# Patient Record
Sex: Female | Born: 1945 | Race: White | Hispanic: No | Smoking: Current some day smoker
Health system: Southern US, Community
[De-identification: ages and names within clinical notes are randomized; demographics above are authoritative.]

## PROBLEM LIST (undated history)

## (undated) DIAGNOSIS — G4733 Obstructive sleep apnea (adult) (pediatric): Secondary | ICD-10-CM

## (undated) DIAGNOSIS — M779 Enthesopathy, unspecified: Secondary | ICD-10-CM

## (undated) DIAGNOSIS — I779 Disorder of arteries and arterioles, unspecified: Secondary | ICD-10-CM

## (undated) DIAGNOSIS — R269 Unspecified abnormalities of gait and mobility: Secondary | ICD-10-CM

## (undated) DIAGNOSIS — C801 Malignant (primary) neoplasm, unspecified: Secondary | ICD-10-CM

## (undated) DIAGNOSIS — M1990 Unspecified osteoarthritis, unspecified site: Secondary | ICD-10-CM

## (undated) DIAGNOSIS — R413 Other amnesia: Secondary | ICD-10-CM

## (undated) DIAGNOSIS — M810 Age-related osteoporosis without current pathological fracture: Secondary | ICD-10-CM

## (undated) DIAGNOSIS — Z9989 Dependence on other enabling machines and devices: Secondary | ICD-10-CM

## (undated) DIAGNOSIS — I739 Peripheral vascular disease, unspecified: Secondary | ICD-10-CM

## (undated) DIAGNOSIS — I4819 Other persistent atrial fibrillation: Secondary | ICD-10-CM

## (undated) DIAGNOSIS — I509 Heart failure, unspecified: Secondary | ICD-10-CM

## (undated) DIAGNOSIS — I10 Essential (primary) hypertension: Secondary | ICD-10-CM

## (undated) DIAGNOSIS — I340 Nonrheumatic mitral (valve) insufficiency: Secondary | ICD-10-CM

## (undated) DIAGNOSIS — K5790 Diverticulosis of intestine, part unspecified, without perforation or abscess without bleeding: Secondary | ICD-10-CM

## (undated) DIAGNOSIS — I2510 Atherosclerotic heart disease of native coronary artery without angina pectoris: Secondary | ICD-10-CM

## (undated) DIAGNOSIS — K922 Gastrointestinal hemorrhage, unspecified: Secondary | ICD-10-CM

## (undated) DIAGNOSIS — K3184 Gastroparesis: Secondary | ICD-10-CM

## (undated) DIAGNOSIS — F418 Other specified anxiety disorders: Secondary | ICD-10-CM

## (undated) HISTORY — DX: Gastroparesis: K31.84

## (undated) HISTORY — DX: Peripheral vascular disease, unspecified: I73.9

## (undated) HISTORY — DX: Nonrheumatic mitral (valve) insufficiency: I34.0

## (undated) HISTORY — PX: BREAST LUMPECTOMY: SHX2

## (undated) HISTORY — DX: Disorder of arteries and arterioles, unspecified: I77.9

## (undated) HISTORY — PX: CORONARY ANGIOPLASTY: SHX604

## (undated) HISTORY — PX: BACK SURGERY: SHX140

## (undated) HISTORY — DX: Gastrointestinal hemorrhage, unspecified: K92.2

## (undated) HISTORY — PX: ABDOMINAL HYSTERECTOMY: SHX81

## (undated) HISTORY — PX: BYPASS GRAFT: SHX909

## (undated) HISTORY — DX: Heart failure, unspecified: I50.9

## (undated) HISTORY — DX: Diverticulosis of intestine, part unspecified, without perforation or abscess without bleeding: K57.90

## (undated) HISTORY — PX: CHOLECYSTECTOMY: SHX55

## (undated) HISTORY — PX: APPENDECTOMY: SHX54

## (undated) HISTORY — DX: Enthesopathy, unspecified: M77.9

## (undated) HISTORY — PX: EYE SURGERY: SHX253

## (undated) HISTORY — DX: Other persistent atrial fibrillation: I48.19

## (undated) HISTORY — PX: TONSILLECTOMY: SUR1361

---

## 2015-07-03 NOTE — Progress Notes (Signed)
Electrophysiology Office Note   Date:  07/04/2015   ID:  Nicole Randall, DOB 1945-11-28, MRN [id number]  PCP:  No primary care provider on file.  Primary Electrophysiologist:  Staton Markey Meredith Leeds, MD    Chief Complaint  Patient presents with  . New Patient (Initial Visit)     History of Present Illness: Nicole Randall is a 70 y.o. female who presents today for electrophysiology evaluation.   She has a history of coronary artery disease status post CABG with unknown bypassed vessels, and persistent atrial fibrillation. She takes Eliquis and amiodarone. She presents today with complaints of shortness of breath and swelling. She says that the swelling is noted going on for a few weeks and she has had weeping from her legs. She says that she gets short of breath with walking just about any distance. She feels like she can lay flat at night and does not wake up in the middle of night feeling short of breath. She has had no chest pain.   Today, he denies symptoms of orthopnea, PND, claudication, dizziness, presyncope, syncope, bleeding, or neurologic sequela. The patient is tolerating medications without difficulties and is otherwise without complaint today.    Past Medical History  Diagnosis Date  . CHF (congestive heart failure) ([place])   . Afib ([place])   . Bone spur    Past Surgical History  Procedure Laterality Date  . Bypass graft    . Back surgery    . Appendectomy    . Coronary angioplasty       Current Outpatient Prescriptions  Medication Sig Dispense Refill  . acetaminophen (TYLENOL) 650 MG CR tablet Take 650 mg by mouth every 8 (eight) hours as needed for pain.    Marland Kitchen albuterol (PROVENTIL) (2.5 MG/3ML) 0.083% nebulizer solution Take 2.5 mg by nebulization every 6 (six) hours as needed for wheezing or shortness of breath.    Marland Kitchen amiodarone (PACERONE) 200 MG tablet Take 200 mg by mouth daily.    Marland Kitchen apixaban (ELIQUIS) 5 MG TABS tablet Take 5 mg by mouth 2 (two) times daily.    Marland Kitchen  aspirin 81 MG tablet Take 81 mg by mouth daily.    Marland Kitchen atorvastatin (LIPITOR) 80 MG tablet Take 80 mg by mouth daily.    . calcium carbonate (TUMS EX) 750 MG chewable tablet Chew 1 tablet by mouth 3 (three) times daily as needed for heartburn.    . Cranberry 450 MG CAPS Take 1 capsule by mouth daily.    . cyanocobalamin 1000 MCG tablet Take 100 mcg by mouth daily.    . DULoxetine (CYMBALTA) 60 MG capsule Take 60 mg by mouth daily.    Marland Kitchen FLUoxetine (PROZAC) 10 MG tablet Take 10 mg by mouth daily.    . furosemide (LASIX) 20 MG tablet Take 20 mg by mouth daily.    Marland Kitchen gabapentin (NEURONTIN) 300 MG capsule Take 300 mg by mouth daily.    Marland Kitchen ibuprofen (ADVIL,MOTRIN) 400 MG tablet Take 400 mg by mouth every 6 (six) hours as needed (pain). Take 1.5-2 tablets (600-870m) every 6 hrs as needed    . Melatonin 5 MG CAPS Take 1 capsule by mouth at bedtime as needed (sleep).    . midodrine (PROAMATINE) 5 MG tablet Take 5 mg by mouth 3 (three) times daily with meals.    . mirabegron ER (MYRBETRIQ) 25 MG TB24 tablet Take 25 mg by mouth daily.    . nitroGLYCERIN (NITROSTAT) 0.4 MG SL tablet Place 0.4 mg under the  tongue every 5 (five) minutes as needed for chest pain. Up to 3 doses    . omeprazole (PRILOSEC) 20 MG capsule Take 20 mg by mouth 2 (two) times daily before a meal.    . ondansetron (ZOFRAN) 4 MG tablet Take 4 mg by mouth every 6 (six) hours as needed for nausea or vomiting.    . polyethylene glycol (MIRALAX / GLYCOLAX) packet Take 17 g by mouth daily as needed (constipation).     . potassium chloride (K-DUR,KLOR-CON) 10 MEQ tablet Take 10 mEq by mouth 2 (two) times daily.    Marland Kitchen senna (SENOKOT) 8.6 MG tablet Take 1 tablet by mouth daily as needed for constipation.    Marland Kitchen spironolactone (ALDACTONE) 25 MG tablet Take 12.5 mg by mouth daily. Take one-half tablet (12.34m)  daily    . traMADol (ULTRAM) 50 MG tablet Take 50 mg by mouth every 6 (six) hours as needed. Take 1-2 tablets (50-1027m every 4 hours as needed  for pain    . traZODone (DESYREL) 50 MG tablet Take 50 mg by mouth at bedtime.     No current facility-administered medications for this visit.    Allergies:   Review of patient's allergies indicates not on file.   Social History:  The patient  reports that he has been smoking.  He does not have any smokeless tobacco history on file. He reports that he does not drink alcohol or use illicit drugs.   Family History:  The patient's family history includes Heart disease in his father; Lung cancer in his mother; Throat cancer in his brother.    ROS:  Please see the history of present illness.   Otherwise, review of systems is positive for palpitations, swelling, cough, DOE, depression, back pain, dizziness.   All other systems are reviewed and negative.    PHYSICAL EXAM: VS:  BP 100/42 mmHg  Pulse 73  Ht 5' 2"  (1.575 m)  Wt 113 lb (51.256 kg)  BMI 20.66 kg/m2 , BMI Body mass index is 20.66 kg/(m^2). GEN: Well nourished, well developed, in no acute distress HEENT: normal Neck: no JVD, carotid bruits, or masses Cardiac: RRR; no murmurs, rubs, or gallops,no edema  Respiratory:  clear to auscultation bilaterally, normal work of breathing GI: soft, nontender, nondistended, + BS MS: no deformity or atrophy Skin: warm and dry Neuro:  Strength and sensation are intact Psych: euthymic mood, full affect  EKG:  EKG is ordered today. The ekg ordered today shows sinus rhythm, inferior Q waves, rate 73  Recent Labs: No results found for requested labs within last 365 days.    Lipid Panel  No results found for: CHOL, TRIG, HDL, CHOLHDL, VLDL, LDLCALC, LDLDIRECT   Wt Readings from Last 3 Encounters:  07/04/15 113 lb (51.256 kg)     ASSESSMENT AND PLAN:  1.  Persistent trial fibrillation: Has CHADS2VASc of at least 3, and is therefore anticoagulated with Eliquis. She is also on amiodarone and is in sinus rhythm today. Monitor her amiodarone Kimiyo Carmicheal check liver function tests today. We'll  continue current management otherwise.  2. Coronary artery disease:has had a CABG with unknown vessel bypass. No chest pain currently.  3. LE edema: has lower extremity edema which is new and is weeping. To further evaluate this,we'll get an echocardiogram, a comprehensive metabolic panel, CBC and a BNP. She may require an increase in her Lasix. We are currently working to get the records from her cardiologist in [place]  Current medicines are reviewed at length  with the patient today.   The patient does not have concerns regarding his medicines.  The following changes were made today:  none  Labs/ tests ordered today include:  Orders Placed This Encounter  Procedures  . CBC w/Diff  . B Nat Peptide  . Comp Met (CMET)  . EKG 12-Lead  . ECHOCARDIOGRAM COMPLETE     Disposition:   FU with Suanne Minahan post TTE  Signed, Jenayah Antu Meredith Leeds, MD  07/04/2015 11:35 AM     [facility] [address] [place] [address] [phone] (office) [phone] (fax)

## 2015-07-04 ENCOUNTER — Encounter: Payer: Self-pay | Admitting: Cardiology

## 2015-07-04 ENCOUNTER — Inpatient Hospital Stay (HOSPITAL_COMMUNITY)
Admission: EM | Admit: 2015-07-04 | Discharge: 2015-07-09 | DRG: 377 | Payer: MEDICARE | Attending: Internal Medicine | Admitting: Internal Medicine

## 2015-07-04 ENCOUNTER — Ambulatory Visit (INDEPENDENT_AMBULATORY_CARE_PROVIDER_SITE_OTHER): Payer: MEDICARE | Admitting: Cardiology

## 2015-07-04 ENCOUNTER — Encounter (HOSPITAL_COMMUNITY): Payer: Self-pay | Admitting: Family Medicine

## 2015-07-04 ENCOUNTER — Telehealth: Payer: Self-pay | Admitting: Cardiology

## 2015-07-04 VITALS — BP 100/42 | HR 73 | Ht 62.0 in | Wt 113.0 lb

## 2015-07-04 DIAGNOSIS — K921 Melena: Secondary | ICD-10-CM | POA: Diagnosis present

## 2015-07-04 DIAGNOSIS — M542 Cervicalgia: Secondary | ICD-10-CM | POA: Diagnosis present

## 2015-07-04 DIAGNOSIS — D649 Anemia, unspecified: Secondary | ICD-10-CM | POA: Diagnosis present

## 2015-07-04 DIAGNOSIS — K3184 Gastroparesis: Secondary | ICD-10-CM | POA: Diagnosis present

## 2015-07-04 DIAGNOSIS — Z8249 Family history of ischemic heart disease and other diseases of the circulatory system: Secondary | ICD-10-CM | POA: Diagnosis not present

## 2015-07-04 DIAGNOSIS — R0602 Shortness of breath: Secondary | ICD-10-CM

## 2015-07-04 DIAGNOSIS — D62 Acute posthemorrhagic anemia: Secondary | ICD-10-CM | POA: Diagnosis present

## 2015-07-04 DIAGNOSIS — G8929 Other chronic pain: Secondary | ICD-10-CM | POA: Diagnosis present

## 2015-07-04 DIAGNOSIS — R627 Adult failure to thrive: Secondary | ICD-10-CM | POA: Diagnosis present

## 2015-07-04 DIAGNOSIS — R64 Cachexia: Secondary | ICD-10-CM | POA: Diagnosis present

## 2015-07-04 DIAGNOSIS — I480 Paroxysmal atrial fibrillation: Secondary | ICD-10-CM | POA: Diagnosis present

## 2015-07-04 DIAGNOSIS — F329 Major depressive disorder, single episode, unspecified: Secondary | ICD-10-CM | POA: Diagnosis present

## 2015-07-04 DIAGNOSIS — K922 Gastrointestinal hemorrhage, unspecified: Secondary | ICD-10-CM

## 2015-07-04 DIAGNOSIS — F17210 Nicotine dependence, cigarettes, uncomplicated: Secondary | ICD-10-CM | POA: Diagnosis present

## 2015-07-04 DIAGNOSIS — Z86010 Personal history of colonic polyps: Secondary | ICD-10-CM

## 2015-07-04 DIAGNOSIS — D500 Iron deficiency anemia secondary to blood loss (chronic): Secondary | ICD-10-CM | POA: Diagnosis present

## 2015-07-04 DIAGNOSIS — Z7982 Long term (current) use of aspirin: Secondary | ICD-10-CM

## 2015-07-04 DIAGNOSIS — G4733 Obstructive sleep apnea (adult) (pediatric): Secondary | ICD-10-CM | POA: Diagnosis present

## 2015-07-04 DIAGNOSIS — L89151 Pressure ulcer of sacral region, stage 1: Secondary | ICD-10-CM | POA: Diagnosis present

## 2015-07-04 DIAGNOSIS — Z79899 Other long term (current) drug therapy: Secondary | ICD-10-CM

## 2015-07-04 DIAGNOSIS — E785 Hyperlipidemia, unspecified: Secondary | ICD-10-CM | POA: Diagnosis present

## 2015-07-04 DIAGNOSIS — Z6821 Body mass index (BMI) 21.0-21.9, adult: Secondary | ICD-10-CM | POA: Diagnosis not present

## 2015-07-04 DIAGNOSIS — D696 Thrombocytopenia, unspecified: Secondary | ICD-10-CM | POA: Diagnosis present

## 2015-07-04 DIAGNOSIS — E43 Unspecified severe protein-calorie malnutrition: Secondary | ICD-10-CM | POA: Insufficient documentation

## 2015-07-04 DIAGNOSIS — Z7901 Long term (current) use of anticoagulants: Secondary | ICD-10-CM

## 2015-07-04 DIAGNOSIS — I5023 Acute on chronic systolic (congestive) heart failure: Secondary | ICD-10-CM | POA: Diagnosis present

## 2015-07-04 DIAGNOSIS — Z951 Presence of aortocoronary bypass graft: Secondary | ICD-10-CM | POA: Diagnosis not present

## 2015-07-04 DIAGNOSIS — N3281 Overactive bladder: Secondary | ICD-10-CM | POA: Diagnosis present

## 2015-07-04 DIAGNOSIS — I2510 Atherosclerotic heart disease of native coronary artery without angina pectoris: Secondary | ICD-10-CM | POA: Diagnosis present

## 2015-07-04 DIAGNOSIS — K5900 Constipation, unspecified: Secondary | ICD-10-CM | POA: Diagnosis present

## 2015-07-04 DIAGNOSIS — I429 Cardiomyopathy, unspecified: Secondary | ICD-10-CM | POA: Diagnosis present

## 2015-07-04 DIAGNOSIS — K5909 Other constipation: Secondary | ICD-10-CM | POA: Diagnosis present

## 2015-07-04 DIAGNOSIS — K5730 Diverticulosis of large intestine without perforation or abscess without bleeding: Secondary | ICD-10-CM | POA: Diagnosis present

## 2015-07-04 DIAGNOSIS — Z801 Family history of malignant neoplasm of trachea, bronchus and lung: Secondary | ICD-10-CM

## 2015-07-04 DIAGNOSIS — Z9989 Dependence on other enabling machines and devices: Secondary | ICD-10-CM

## 2015-07-04 DIAGNOSIS — D509 Iron deficiency anemia, unspecified: Secondary | ICD-10-CM | POA: Diagnosis present

## 2015-07-04 DIAGNOSIS — I10 Essential (primary) hypertension: Secondary | ICD-10-CM | POA: Insufficient documentation

## 2015-07-04 DIAGNOSIS — I509 Heart failure, unspecified: Secondary | ICD-10-CM | POA: Diagnosis not present

## 2015-07-04 DIAGNOSIS — I110 Hypertensive heart disease with heart failure: Secondary | ICD-10-CM | POA: Diagnosis present

## 2015-07-04 DIAGNOSIS — I5020 Unspecified systolic (congestive) heart failure: Secondary | ICD-10-CM | POA: Diagnosis present

## 2015-07-04 DIAGNOSIS — F32A Depression, unspecified: Secondary | ICD-10-CM | POA: Diagnosis present

## 2015-07-04 HISTORY — DX: Essential (primary) hypertension: I10

## 2015-07-04 HISTORY — DX: Age-related osteoporosis without current pathological fracture: M81.0

## 2015-07-04 HISTORY — DX: Other specified anxiety disorders: F41.8

## 2015-07-04 HISTORY — DX: Dependence on other enabling machines and devices: Z99.89

## 2015-07-04 HISTORY — DX: Unspecified abnormalities of gait and mobility: R26.9

## 2015-07-04 HISTORY — DX: Obstructive sleep apnea (adult) (pediatric): G47.33

## 2015-07-04 HISTORY — DX: Atherosclerotic heart disease of native coronary artery without angina pectoris: I25.10

## 2015-07-04 HISTORY — DX: Malignant (primary) neoplasm, unspecified: C80.1

## 2015-07-04 HISTORY — DX: Unspecified osteoarthritis, unspecified site: M19.90

## 2015-07-04 HISTORY — DX: Other amnesia: R41.3

## 2015-07-04 LAB — CBC WITH DIFFERENTIAL/PLATELET
BASOS ABS: 0.1 10*3/uL (ref 0.0–0.1)
Basophils Relative: 1 % (ref 0–1)
EOS ABS: 0 10*3/uL (ref 0.0–0.7)
EOS PCT: 0 % (ref 0–5)
HEMATOCRIT: 16.7 % — AB (ref 39.0–52.0)
Hemoglobin: 4.9 g/dL — CL (ref 13.0–17.0)
LYMPHS ABS: 0.8 10*3/uL (ref 0.7–4.0)
LYMPHS PCT: 12 % (ref 12–46)
MCH: 23.8 pg — AB (ref 26.0–34.0)
MCHC: 29.3 g/dL — ABNORMAL LOW (ref 30.0–36.0)
MCV: 81.1 fL (ref 78.0–100.0)
MONOS PCT: 14 % — AB (ref 3–12)
MPV: 10.9 fL (ref 8.6–12.4)
Monocytes Absolute: 0.9 10*3/uL (ref 0.1–1.0)
NEUTROS PCT: 73 % (ref 43–77)
Neutro Abs: 4.8 10*3/uL (ref 1.7–7.7)
Platelets: 134 10*3/uL — ABNORMAL LOW (ref 150–400)
RBC: 2.06 MIL/uL — ABNORMAL LOW (ref 4.22–5.81)
RDW: 19.7 % — ABNORMAL HIGH (ref 11.5–15.5)
WBC: 6.6 10*3/uL (ref 4.0–10.5)

## 2015-07-04 LAB — PREPARE RBC (CROSSMATCH)

## 2015-07-04 LAB — COMPREHENSIVE METABOLIC PANEL
ALBUMIN: 2.3 g/dL — AB (ref 3.5–5.0)
ALK PHOS: 86 U/L (ref 38–126)
ALT: 26 U/L (ref 14–54)
AST: 43 U/L — ABNORMAL HIGH (ref 15–41)
Anion gap: 9 (ref 5–15)
BILIRUBIN TOTAL: 0.7 mg/dL (ref 0.3–1.2)
BUN: 14 mg/dL (ref 6–20)
CALCIUM: 8.7 mg/dL — AB (ref 8.9–10.3)
CO2: 25 mmol/L (ref 22–32)
CREATININE: 1.05 mg/dL — AB (ref 0.44–1.00)
Chloride: 105 mmol/L (ref 101–111)
GFR calc Af Amer: >60 mL/min (ref >60)
GFR calc non Af Amer: 53 mL/min — ABNORMAL LOW (ref >60)
GLUCOSE: 84 mg/dL (ref 65–99)
Potassium: 4.1 mmol/L (ref 3.5–5.1)
SODIUM: 139 mmol/L (ref 135–145)
TOTAL PROTEIN: 6.3 g/dL — AB (ref 6.5–8.1)

## 2015-07-04 LAB — CBC
HCT: 16.4 % — ABNORMAL LOW (ref 36.0–46.0)
Hemoglobin: 4.7 g/dL — CL (ref 12.0–15.0)
MCH: 23.4 pg — AB (ref 26.0–34.0)
MCHC: 28.7 g/dL — ABNORMAL LOW (ref 30.0–36.0)
MCV: 81.6 fL (ref 78.0–100.0)
PLATELETS: 127 10*3/uL — AB (ref 150–400)
RBC: 2.01 MIL/uL — ABNORMAL LOW (ref 3.87–5.11)
RDW: 20.6 % — AB (ref 11.5–15.5)
WBC: 5.7 10*3/uL (ref 4.0–10.5)

## 2015-07-04 LAB — PHOSPHORUS: Phosphorus: 3.3 mg/dL (ref 2.5–4.6)

## 2015-07-04 LAB — ABO/RH: ABO/RH(D): O POS

## 2015-07-04 LAB — POC OCCULT BLOOD, ED: FECAL OCCULT BLD: POSITIVE — AB

## 2015-07-04 LAB — BRAIN NATRIURETIC PEPTIDE

## 2015-07-04 LAB — MAGNESIUM: MAGNESIUM: 1.7 mg/dL (ref 1.7–2.4)

## 2015-07-04 MED ORDER — LEVALBUTEROL HCL 1.25 MG/0.5ML IN NEBU: 1.2500 mg | INHALATION_SOLUTION | Freq: Four times a day (QID) | RESPIRATORY_TRACT | Status: DC | PRN

## 2015-07-04 MED ORDER — ONDANSETRON HCL 4 MG/2ML IJ SOLN
4.0000 mg | Freq: Four times a day (QID) | INTRAMUSCULAR | Status: DC | PRN
  Administered 2015-07-05: 4 mg via INTRAVENOUS
  Filled 2015-07-04: qty 2

## 2015-07-04 MED ORDER — SODIUM CHLORIDE 0.9% FLUSH
3.0000 mL | Freq: Two times a day (BID) | INTRAVENOUS | Status: DC
  Administered 2015-07-04 – 2015-07-08 (×8): 3 mL via INTRAVENOUS

## 2015-07-04 MED ORDER — ONDANSETRON HCL 4 MG PO TABS: 4.0000 mg | ORAL_TABLET | Freq: Four times a day (QID) | ORAL | Status: DC | PRN

## 2015-07-04 MED ORDER — MAGNESIUM SULFATE 2 GM/50ML IV SOLN
2.0000 g | Freq: Once | INTRAVENOUS | Status: AC
  Administered 2015-07-05: 2 g via INTRAVENOUS
  Filled 2015-07-04: qty 50

## 2015-07-04 MED ORDER — SODIUM CHLORIDE 0.9 % IV SOLN
Freq: Once | INTRAVENOUS | Status: AC
  Administered 2015-07-04: 23:00:00 via INTRAVENOUS

## 2015-07-04 MED ORDER — SODIUM CHLORIDE 0.9 % IV SOLN
10.0000 mL/h | Freq: Once | INTRAVENOUS | Status: AC
  Administered 2015-07-04: 10 mL/h via INTRAVENOUS

## 2015-07-04 MED ORDER — IPRATROPIUM BROMIDE 0.02 % IN SOLN: 0.5000 mg | Freq: Four times a day (QID) | RESPIRATORY_TRACT | Status: DC | PRN

## 2015-07-04 MED ORDER — MORPHINE SULFATE (PF) 2 MG/ML IV SOLN
2.0000 mg | INTRAVENOUS | Status: DC | PRN
  Administered 2015-07-04 – 2015-07-08 (×6): 2 mg via INTRAVENOUS
  Filled 2015-07-04 (×6): qty 1

## 2015-07-04 MED ORDER — PANTOPRAZOLE SODIUM 40 MG IV SOLR
40.0000 mg | Freq: Two times a day (BID) | INTRAVENOUS | Status: DC
  Administered 2015-07-04 – 2015-07-05 (×2): 40 mg via INTRAVENOUS
  Filled 2015-07-04 (×2): qty 40

## 2015-07-04 NOTE — ED Provider Notes (Signed)
CSN: [id number]     Arrival date & time 07/04/15  1553 History   First MD Initiated Contact with Patient 07/04/15 1647     Chief Complaint  Patient presents with  . low hgb       HPI Sent in for low Hgb.  Seen by cardiologist this am.  Has had weakness, fatigue and SOB for last few weeks.  Just moved to the area.  Had some melena for the past few months and unexplained weight loss.  On Elequis for afib and carotid blockages. Past Medical History  Diagnosis Date  . CHF (congestive heart failure) ([place])   . Afib ([place])   . Bone spur   . Arthritis   . Cancer ([facility])     carcinoma left leg  . Coronary artery disease   . Hypertension    Past Surgical History  Procedure Laterality Date  . Bypass graft    . Back surgery    . Appendectomy    . Coronary angioplasty    . Cholecystectomy    . Tonsillectomy    . Abdominal hysterectomy      partial  . Eye surgery      bilat cataract, lasix surgery  . Breast lumpectomy Left     1980's   Family History  Problem Relation Age of Onset  . Lung cancer Mother   . Heart disease Father   . Throat cancer Brother    Social History  Substance Use Topics  . Smoking status: Current Some Day Smoker -- 0.20 packs/day  . Smokeless tobacco: Never Used  . Alcohol Use: No   OB History    No data available     Review of Systems  Constitutional: Positive for activity change and unexpected weight change.  Cardiovascular: Positive for leg swelling.  Gastrointestinal: Positive for abdominal distention. Negative for abdominal pain.  Neurological: Positive for dizziness.  All other systems reviewed and are negative.     Allergies  Review of patient's allergies indicates no known allergies.  Home Medications   Prior to Admission medications   Medication Sig Start Date End Date Taking? Authorizing Provider  albuterol (PROVENTIL) (2.5 MG/3ML) 0.083% nebulizer solution Take 2.5 mg by nebulization every 6 (six) hours as needed for wheezing or  shortness of breath.   Yes Historical Provider, MD  amiodarone (PACERONE) 200 MG tablet Take 200 mg by mouth daily.   Yes Historical Provider, MD  apixaban (ELIQUIS) 5 MG TABS tablet Take 5 mg by mouth 2 (two) times daily.   Yes Historical Provider, MD  aspirin 81 MG tablet Take 81 mg by mouth daily.   Yes Historical Provider, MD  atorvastatin (LIPITOR) 80 MG tablet Take 80 mg by mouth daily.   Yes Historical Provider, MD  calcium carbonate (TUMS EX) 750 MG chewable tablet Chew 1 tablet by mouth 3 (three) times daily as needed for heartburn.   Yes Historical Provider, MD  Cranberry 450 MG CAPS Take 1 capsule by mouth daily.   Yes Historical Provider, MD  cyanocobalamin 1000 MCG tablet Take 100 mcg by mouth daily.   Yes Historical Provider, MD  DULoxetine (CYMBALTA) 60 MG capsule Take 60 mg by mouth daily.   Yes Historical Provider, MD  FLUoxetine (PROZAC) 10 MG tablet Take 10 mg by mouth daily.   Yes Historical Provider, MD  furosemide (LASIX) 20 MG tablet Take 20 mg by mouth daily.   Yes Historical Provider, MD  gabapentin (NEURONTIN) 300 MG capsule Take 300 mg by mouth  daily.   Yes Historical Provider, MD  Melatonin 5 MG CAPS Take 1 capsule by mouth at bedtime as needed (sleep).   Yes Historical Provider, MD  midodrine (PROAMATINE) 5 MG tablet Take 5 mg by mouth 3 (three) times daily with meals.   Yes Historical Provider, MD  mirabegron ER (MYRBETRIQ) 25 MG TB24 tablet Take 25 mg by mouth daily.   Yes Historical Provider, MD  nitroGLYCERIN (NITROSTAT) 0.4 MG SL tablet Place 0.4 mg under the tongue every 5 (five) minutes as needed for chest pain. Up to 3 doses   Yes Historical Provider, MD  omeprazole (PRILOSEC) 20 MG capsule Take 20 mg by mouth 2 (two) times daily before a meal.   Yes Historical Provider, MD  ondansetron (ZOFRAN) 4 MG tablet Take 4 mg by mouth every 6 (six) hours as needed for nausea or vomiting.   Yes Historical Provider, MD  polyethylene glycol (MIRALAX / GLYCOLAX) packet Take  17 g by mouth daily as needed (constipation).    Yes Historical Provider, MD  potassium chloride (K-DUR,KLOR-CON) 10 MEQ tablet Take 10 mEq by mouth 2 (two) times daily.   Yes Historical Provider, MD  senna (SENOKOT) 8.6 MG tablet Take 1 tablet by mouth daily as needed for constipation.   Yes Historical Provider, MD  spironolactone (ALDACTONE) 25 MG tablet Take 12.5 mg by mouth daily. Take one-half tablet (12.5mg )  daily   Yes Historical Provider, MD  traMADol (ULTRAM) 50 MG tablet Take 50 mg by mouth every 6 (six) hours as needed. Take 1-2 tablets (50-100mg ) every 4 hours as needed for pain   Yes Historical Provider, MD  traZODone (DESYREL) 50 MG tablet Take 50 mg by mouth at bedtime.   Yes Historical Provider, MD   BP 97/48 mmHg  Pulse 72  Temp(Src) 98.3 F (36.8 C) (Oral)  Resp 18  Ht 5\' 2"  (1.575 m)  Wt 116 lb 2.9 oz (52.7 kg)  BMI 21.24 kg/m2  SpO2 95% Physical Exam  Constitutional: She is oriented to person, place, and time. She appears well-developed and well-nourished. No distress.  HENT:  Head: Normocephalic and atraumatic.  Eyes: Pupils are equal, round, and reactive to light.  Neck: Normal range of motion.  Cardiovascular: Normal rate and intact distal pulses.   Pulmonary/Chest: No respiratory distress.  Abdominal: Normal appearance. She exhibits no distension. There is no tenderness. There is no rebound.  Musculoskeletal: Normal range of motion.  Neurological: She is alert and oriented to person, place, and time. No cranial nerve deficit.  Skin: Skin is warm and dry. No rash noted.  Psychiatric: She has a normal mood and affect. Her behavior is normal.  Nursing note and vitals reviewed.   ED Course  Procedures (including critical care time) Labs Review Labs Reviewed  COMPREHENSIVE METABOLIC PANEL - Abnormal; Notable for the following:    Creatinine, Ser 1.05 (*)    Calcium 8.7 (*)    Total Protein 6.3 (*)    Albumin 2.3 (*)    AST 43 (*)    GFR calc non Af Amer  53 (*)    All other components within normal limits  CBC - Abnormal; Notable for the following:    RBC 2.01 (*)    Hemoglobin 4.7 (*)    HCT 16.4 (*)    MCH 23.4 (*)    MCHC 28.7 (*)    RDW 20.6 (*)    Platelets 127 (*)    All other components within normal limits  COMPREHENSIVE METABOLIC PANEL -  Abnormal; Notable for the following:    Creatinine, Ser 1.03 (*)    Calcium 8.6 (*)    Total Protein 6.1 (*)    Albumin 2.3 (*)    GFR calc non Af Amer 54 (*)    All other components within normal limits  CBC WITH DIFFERENTIAL/PLATELET - Abnormal; Notable for the following:    RBC 3.47 (*)    Hemoglobin 9.0 (*)    HCT 28.3 (*)    MCH 25.9 (*)    RDW 17.6 (*)    Platelets 72 (*)    All other components within normal limits  POC OCCULT BLOOD, ED - Abnormal; Notable for the following:    Fecal Occult Bld POSITIVE (*)    All other components within normal limits  MAGNESIUM  PHOSPHORUS  URINALYSIS, ROUTINE W REFLEX MICROSCOPIC (NOT AT [facility])  CBC WITH DIFFERENTIAL/PLATELET  TYPE AND SCREEN  PREPARE RBC (CROSSMATCH)  ABO/RH  PREPARE RBC (CROSSMATCH)    Imaging Review No results found. I have personally reviewed and evaluated these images and lab results as part of my medical decision-making.   EKG Interpretation   Date/Time:  Wednesday July 04 2015 17:22:53 EST Ventricular Rate:  102 PR Interval:  124 QRS Duration: 117 QT Interval:  385 QTC Calculation: 501 R Axis:   93 Text Interpretation:  Sinus tachycardia Left posterior fascicular block  Low voltage, extremity leads Probable lateral infarct, age indeterminate  Confirmed by Benn Tarver  MD, Jamarques Pinedo [phone]) on 07/04/2015 6:15:14 PM     I discussed with Dr. Fuller Plan, GI, who will see patient in AM.  Will admit to hospitalist. MDM   Final diagnoses:  Gastrointestinal hemorrhage, unspecified gastritis, unspecified gastrointestinal hemorrhage type  Anemia, unspecified anemia type        Leonard Schwartz, MD 07/05/15 2256

## 2015-07-04 NOTE — Telephone Encounter (Signed)
See lab result from today for documentation on this critical lab

## 2015-07-04 NOTE — ED Notes (Signed)
Critical Lab Value  Hgb 4.7

## 2015-07-04 NOTE — ED Notes (Signed)
Pt here for low hgb of 4. sts weakness, dizziness, and hypotension.

## 2015-07-04 NOTE — Patient Instructions (Signed)
Medication Instructions:  Your physician recommends that you continue on your current medications as directed. Please refer to the Current Medication list given to you today.  Labwork: Today: CMET, CBCD, BNP  Testing/Procedures: Your physician has requested that you have an echocardiogram. Echocardiography is a painless test that uses sound waves to create images of your heart. It provides your doctor with information about the size and shape of your heart and how well your heart's chambers and valves are working. This procedure takes approximately one hour. There are no restrictions for this procedure.  Follow-Up: To be determined once lab and echo results have been reviewed.  Thank you for choosing [facility]!!

## 2015-07-04 NOTE — Progress Notes (Signed)
Called ER RN for report. RN not ready.

## 2015-07-04 NOTE — Telephone Encounter (Signed)
[facility] callign with Critical Labs

## 2015-07-04 NOTE — H&P (Signed)
[facility] History and Physical  Nicole Randall [id number] DOB: 1945-12-27 DOA: 07/04/2015  Referring physician: Leonard Schwartz, M.D. PCP: No primary care provider on file.   Chief Complaint: Low hemoglobin level.  HPI: Nicole Randall is a 70 y.o. female with a past medical history of systolic CHF, CAD, carotid artery disease, paroxysmal atrial fibrillation, hypertension, hyperlipidemia, depression, anxiety who comes to the emergency department after she was called from her cardiologist's office about her results from earlier in the day labs, due to having a hemoglobin level of 4.7 g/dL.  Per patient, she has had increased fatigue, dyspnea, weakness for several weeks. She is states that she has had some melena in the stools as well. Her daughter reports that her last hemoglobin level over a month ago was 11 g/dL. She denies current abdominal pain, hematemesis, hematochezia, but complains of loss of appetite and occasional nausea.  The patient recently moved from [place], [place] to the [place] area where her daughter leaves. She was staying at an assisted living facility after having multiple hospitalizations in the past year, failure to thrive, decreased appetite and weight loss of about 50 pounds since then. She is receiving home health wound care due to chronic edema, with occasional oozing blisters and erythema. She denies fevers, but complains of chills and fatigue.  When seen in the emergency department, the patient was in no acute distress. We are admitting her for blood transfusions and GI evaluation during this hospitalization.   Review of Systems:  Constitutional:  Positive chills and fatigue.  No weight loss, night sweats, Fevers. HEENT:  No headaches, Difficulty swallowing,Tooth/dental problems,Sore throat,  No sneezing, itching, ear ache, nasal congestion, post nasal drip,  Cardio-vascular:  Positive swelling in lower extremities.  No chest pain, Orthopnea, PND,  anasarca, dizziness, palpitations  GI:  As above mentioned Resp:  Positive dyspnea, occasional productive cough, no wheezing, no hemoptysis. Skin:  Chronic erythema and edema of lower extremities. GU:  no dysuria, change in color of urine, no urgency or frequency. No flank pain.  Musculoskeletal:  Occasional arthralgias/myalgias/weakness and overall decreased range of motion. Psych:  History of depression and anxiety.  Past Medical History  Diagnosis Date  . CHF (congestive heart failure) ([facility])   . Afib ([facility])   . Bone spur   . Arthritis   . Cancer (HCC)     carcinoma left leg  . Coronary artery disease   . Hypertension    Past Surgical History  Procedure Laterality Date  . Bypass graft    . Back surgery    . Appendectomy    . Coronary angioplasty    . Cholecystectomy    . Tonsillectomy    . Abdominal hysterectomy      partial  . Eye surgery      bilat cataract, lasix surgery  . Breast lumpectomy Left     1980's   Social History:  reports that she has been smoking.  She has never used smokeless tobacco. She reports that she does not drink alcohol or use illicit drugs.  No Known Allergies  Family History  Problem Relation Age of Onset  . Lung cancer Mother   . Heart disease Father   . Throat cancer Brother     Prior to Admission medications   Medication Sig Start Date End Date Taking? Authorizing Provider  acetaminophen (TYLENOL) 650 MG CR tablet Take 650 mg by mouth every 8 (eight) hours as needed for pain.    Historical Provider, MD  albuterol (PROVENTIL) (  2.5 MG/3ML) 0.083% nebulizer solution Take 2.5 mg by nebulization every 6 (six) hours as needed for wheezing or shortness of breath.    Historical Provider, MD  amiodarone (PACERONE) 200 MG tablet Take 200 mg by mouth daily.    Historical Provider, MD  apixaban (ELIQUIS) 5 MG TABS tablet Take 5 mg by mouth 2 (two) times daily.    Historical Provider, MD  aspirin 81 MG tablet Take 81 mg by mouth daily.     Historical Provider, MD  atorvastatin (LIPITOR) 80 MG tablet Take 80 mg by mouth daily.    Historical Provider, MD  calcium carbonate (TUMS EX) 750 MG chewable tablet Chew 1 tablet by mouth 3 (three) times daily as needed for heartburn.    Historical Provider, MD  Cranberry 450 MG CAPS Take 1 capsule by mouth daily.    Historical Provider, MD  cyanocobalamin 1000 MCG tablet Take 100 mcg by mouth daily.    Historical Provider, MD  DULoxetine (CYMBALTA) 60 MG capsule Take 60 mg by mouth daily.    Historical Provider, MD  FLUoxetine (PROZAC) 10 MG tablet Take 10 mg by mouth daily.    Historical Provider, MD  furosemide (LASIX) 20 MG tablet Take 20 mg by mouth daily.    Historical Provider, MD  gabapentin (NEURONTIN) 300 MG capsule Take 300 mg by mouth daily.    Historical Provider, MD  Melatonin 5 MG CAPS Take 1 capsule by mouth at bedtime as needed (sleep).    Historical Provider, MD  midodrine (PROAMATINE) 5 MG tablet Take 5 mg by mouth 3 (three) times daily with meals.    Historical Provider, MD  mirabegron ER (MYRBETRIQ) 25 MG TB24 tablet Take 25 mg by mouth daily.    Historical Provider, MD  nitroGLYCERIN (NITROSTAT) 0.4 MG SL tablet Place 0.4 mg under the tongue every 5 (five) minutes as needed for chest pain. Up to 3 doses    Historical Provider, MD  omeprazole (PRILOSEC) 20 MG capsule Take 20 mg by mouth 2 (two) times daily before a meal.    Historical Provider, MD  ondansetron (ZOFRAN) 4 MG tablet Take 4 mg by mouth every 6 (six) hours as needed for nausea or vomiting.    Historical Provider, MD  polyethylene glycol (MIRALAX / GLYCOLAX) packet Take 17 g by mouth daily as needed (constipation).     Historical Provider, MD  potassium chloride (K-DUR,KLOR-CON) 10 MEQ tablet Take 10 mEq by mouth 2 (two) times daily.    Historical Provider, MD  senna (SENOKOT) 8.6 MG tablet Take 1 tablet by mouth daily as needed for constipation.    Historical Provider, MD  spironolactone (ALDACTONE) 25 MG tablet  Take 12.5 mg by mouth daily. Take one-half tablet (12.5mg )  daily    Historical Provider, MD  traMADol (ULTRAM) 50 MG tablet Take 50 mg by mouth every 6 (six) hours as needed. Take 1-2 tablets (50-100mg ) every 4 hours as needed for pain    Historical Provider, MD  traZODone (DESYREL) 50 MG tablet Take 50 mg by mouth at bedtime.    Historical Provider, MD   Physical Exam: Filed Vitals:   07/04/15 1801 07/04/15 1815 07/04/15 1819 07/04/15 1900  BP: 120/60 107/57 107/57 110/57  Pulse: 102 102 102 103  Temp:   98.6 F (37 C)   TempSrc:   Oral   Resp: 19 19 17 19   SpO2: 97% 100% 100% 99%    Wt Readings from Last 3 Encounters:  07/04/15 51.256 kg (113 lb)  General:  Appears calm and comfortable Eyes: PERRL, normal lids, irises & conjunctiva ENT: grossly normal hearing, lips & tongue Neck: no LAD, masses or thyromegaly Cardiovascular: RRR, no m/r/g. Trace LE edema. Telemetry: SR, no arrhythmias  Respiratory: CTA bilaterally, no w/r/r. Normal respiratory effort. Abdomen: BS+, soft, mild LLQ tenderness, no guarding, no rebound tenderness. Skin: Positive erythema and mild edema on LLE. Musculoskeletal: grossly normal tone BUE/BLE Psychiatric: grossly normal mood and affect, speech fluent and appropriate Neurologic: AAO x3, grossly non-focal.          Labs on Admission:  Basic Metabolic Panel:  Recent Labs Lab 07/04/15 1609  NA 139  K 4.1  CL 105  CO2 25  GLUCOSE 84  BUN 14  CREATININE 1.05*  CALCIUM 8.7*  MG 1.7  PHOS 3.3   Liver Function Tests:  Recent Labs Lab 07/04/15 1609  AST 43*  ALT 26  ALKPHOS 86  BILITOT 0.7  PROT 6.3*  ALBUMIN 2.3*   CBC:  Recent Labs Lab 07/04/15 1127 07/04/15 1609  WBC 6.6 5.7  NEUTROABS 4.8  --   HGB 4.9* 4.7*  HCT 16.7* 16.4*  MCV 81.1 81.6  PLT 134* 127*     EKG: Independently reviewed. Vent. rate 102 BPM PR interval 124 ms QRS duration 117 ms QT/QTc 385/501 ms P-R-T axes 0 93 90 Sinus tachycardia Left  posterior fascicular block Low voltage, extremity leads Probable lateral infarct, age indeterminate  Assessment/Plan Principal Problem:   UGI bleed   Symptomatic anemia Admit to telemetry. Hold eliquis and aspirin. Continue PRBCs transfusion. Monitor hematocrit and hemoglobin. GI evaluation in a.m.  Active Problems:   Systolic CHF (HCC) Stable. Resume diuretics once seen by GI.    CAD (coronary artery disease) Stable, denies chest pain. Resume aspirin once cleared by GI.    PAF (paroxysmal atrial fibrillation) (HCC) Currently in sinus rhythm. Continue telemetry monitoring. Monitor electrolytes closely. The coagulation has been held. Resume amiodarone after seen by GI.    Stage I pressure ulcer of sacral region Continue local care. Monitor closely    Depression Resume Cymbalta, trazodone and fluoxetine once cleared by GI.    Constipation Resume MiraLAX and stool softeners once cleared for oral intake.    OSA on CPAP Continue CPAP at night with a pressure of 8 cm of water.    Hyperlipidemia Resume atorvastatin once cleared by GI.   Dr. Fuller Plan will evaluate in a.m.   Code Status: Full code. DVT Prophylaxis: SCDs Family Communication: Her daughter was present in the room. Disposition Plan: Admit to telemetry, transfuse packed RBCs and GI evaluation in a.m.  Time spent: 70 minutes were spent in the process admission.  Reubin Milan, M.D. [facility] Pager [phone].

## 2015-07-04 NOTE — ED Notes (Signed)
Dr. Beaton MD at bedside. 

## 2015-07-05 ENCOUNTER — Ambulatory Visit (HOSPITAL_COMMUNITY): Payer: MEDICARE

## 2015-07-05 DIAGNOSIS — L89151 Pressure ulcer of sacral region, stage 1: Secondary | ICD-10-CM

## 2015-07-05 DIAGNOSIS — E43 Unspecified severe protein-calorie malnutrition: Secondary | ICD-10-CM

## 2015-07-05 DIAGNOSIS — G4733 Obstructive sleep apnea (adult) (pediatric): Secondary | ICD-10-CM

## 2015-07-05 DIAGNOSIS — K922 Gastrointestinal hemorrhage, unspecified: Secondary | ICD-10-CM

## 2015-07-05 DIAGNOSIS — K921 Melena: Principal | ICD-10-CM

## 2015-07-05 DIAGNOSIS — I509 Heart failure, unspecified: Secondary | ICD-10-CM

## 2015-07-05 DIAGNOSIS — D62 Acute posthemorrhagic anemia: Secondary | ICD-10-CM

## 2015-07-05 LAB — COMPREHENSIVE METABOLIC PANEL
ALK PHOS: 87 U/L (ref 38–126)
ALT: 25 U/L (ref 14–54)
AST: 41 U/L (ref 15–41)
Albumin: 2.3 g/dL — ABNORMAL LOW (ref 3.5–5.0)
Anion gap: 9 (ref 5–15)
BUN: 14 mg/dL (ref 6–20)
CALCIUM: 8.6 mg/dL — AB (ref 8.9–10.3)
CO2: 25 mmol/L (ref 22–32)
CREATININE: 1.03 mg/dL — AB (ref 0.44–1.00)
Chloride: 104 mmol/L (ref 101–111)
GFR, EST NON AFRICAN AMERICAN: 54 mL/min — AB (ref >60)
Glucose, Bld: 81 mg/dL (ref 65–99)
Potassium: 4 mmol/L (ref 3.5–5.1)
SODIUM: 138 mmol/L (ref 135–145)
Total Bilirubin: 1.1 mg/dL (ref 0.3–1.2)
Total Protein: 6.1 g/dL — ABNORMAL LOW (ref 6.5–8.1)

## 2015-07-05 LAB — CBC WITH DIFFERENTIAL/PLATELET
BASOS PCT: 0 %
Basophils Absolute: 0 10*3/uL (ref 0.0–0.1)
EOS ABS: 0 10*3/uL (ref 0.0–0.7)
EOS PCT: 0 %
HCT: 28.3 % — ABNORMAL LOW (ref 36.0–46.0)
HEMOGLOBIN: 9 g/dL — AB (ref 12.0–15.0)
Lymphocytes Relative: 21 %
Lymphs Abs: 1 10*3/uL (ref 0.7–4.0)
MCH: 25.9 pg — ABNORMAL LOW (ref 26.0–34.0)
MCHC: 31.8 g/dL (ref 30.0–36.0)
MCV: 81.6 fL (ref 78.0–100.0)
MONOS PCT: 11 %
Monocytes Absolute: 0.5 10*3/uL (ref 0.1–1.0)
NEUTROS PCT: 68 %
Neutro Abs: 3.2 10*3/uL (ref 1.7–7.7)
PLATELETS: 72 10*3/uL — AB (ref 150–400)
RBC: 3.47 MIL/uL — ABNORMAL LOW (ref 3.87–5.11)
RDW: 17.6 % — AB (ref 11.5–15.5)
WBC: 4.7 10*3/uL (ref 4.0–10.5)

## 2015-07-05 MED ORDER — ATORVASTATIN CALCIUM 80 MG PO TABS
80.0000 mg | ORAL_TABLET | Freq: Every day | ORAL | Status: DC
  Administered 2015-07-05 – 2015-07-09 (×5): 80 mg via ORAL
  Filled 2015-07-05 (×5): qty 1

## 2015-07-05 MED ORDER — FUROSEMIDE 20 MG PO TABS
20.0000 mg | ORAL_TABLET | Freq: Every day | ORAL | Status: DC
  Filled 2015-07-05 (×2): qty 1

## 2015-07-05 MED ORDER — MIDODRINE HCL 5 MG PO TABS
5.0000 mg | ORAL_TABLET | Freq: Three times a day (TID) | ORAL | Status: DC
  Administered 2015-07-06 – 2015-07-09 (×9): 5 mg via ORAL
  Filled 2015-07-05 (×10): qty 1

## 2015-07-05 MED ORDER — ACETAMINOPHEN 325 MG PO TABS: 650.0000 mg | ORAL_TABLET | Freq: Four times a day (QID) | ORAL | Status: DC | PRN

## 2015-07-05 MED ORDER — VITAMIN B-12 100 MCG PO TABS
100.0000 ug | ORAL_TABLET | Freq: Every day | ORAL | Status: DC
  Administered 2015-07-05 – 2015-07-08 (×4): 100 ug via ORAL
  Filled 2015-07-05 (×6): qty 1

## 2015-07-05 MED ORDER — GABAPENTIN 300 MG PO CAPS
300.0000 mg | ORAL_CAPSULE | Freq: Every day | ORAL | Status: DC
  Administered 2015-07-05 – 2015-07-09 (×5): 300 mg via ORAL
  Filled 2015-07-05: qty 3
  Filled 2015-07-05 (×4): qty 1

## 2015-07-05 MED ORDER — ENSURE ENLIVE PO LIQD
237.0000 mL | Freq: Two times a day (BID) | ORAL | Status: DC
  Administered 2015-07-07: 237 mL via ORAL

## 2015-07-05 MED ORDER — AMIODARONE HCL 200 MG PO TABS
200.0000 mg | ORAL_TABLET | Freq: Every day | ORAL | Status: DC
  Administered 2015-07-05 – 2015-07-09 (×5): 200 mg via ORAL
  Filled 2015-07-05 (×5): qty 1

## 2015-07-05 MED ORDER — POTASSIUM CHLORIDE CRYS ER 20 MEQ PO TBCR
10.0000 meq | EXTENDED_RELEASE_TABLET | Freq: Two times a day (BID) | ORAL | Status: DC
  Administered 2015-07-05 – 2015-07-09 (×8): 10 meq via ORAL
  Filled 2015-07-05 (×9): qty 1

## 2015-07-05 MED ORDER — ALBUTEROL SULFATE (2.5 MG/3ML) 0.083% IN NEBU: 2.5000 mg | INHALATION_SOLUTION | RESPIRATORY_TRACT | Status: DC | PRN

## 2015-07-05 MED ORDER — MIRABEGRON ER 25 MG PO TB24
25.0000 mg | ORAL_TABLET | Freq: Every day | ORAL | Status: DC
  Administered 2015-07-05 – 2015-07-09 (×5): 25 mg via ORAL
  Filled 2015-07-05 (×5): qty 1

## 2015-07-05 MED ORDER — NITROGLYCERIN 0.4 MG SL SUBL: 0.4000 mg | SUBLINGUAL_TABLET | SUBLINGUAL | Status: DC | PRN

## 2015-07-05 MED ORDER — PANTOPRAZOLE SODIUM 40 MG PO TBEC
40.0000 mg | DELAYED_RELEASE_TABLET | Freq: Two times a day (BID) | ORAL | Status: DC
  Administered 2015-07-05 – 2015-07-09 (×9): 40 mg via ORAL
  Filled 2015-07-05 (×10): qty 1

## 2015-07-05 NOTE — Consult Note (Signed)
[facility] Consult: 10:16 AM 07/05/2015  LOS: 1 day    Referring Provider: Algis Liming MD  Primary Care Physician:  Doctors making house calls at her assisted living. She doesn't know the name of his physician. Primary Gastroenterologist:  Althia Forts. Previous GI doctor was in [place], [place].   Reason for Consultation:  Anemia. Reported dark stools a couple of weeks ago.   HPI: Nicole Randall is a 70 y.o. female.  Around Christmas the patient moved from [place] to an assisted living facility in [place]. Hx CAD, s/p CABG, Afib, started on Eliquis within the last 6 months. CHF (echocardiogram completed today but results not available yet).  Overactive bladder.  Significant spinal disease, status post 6 spinal surgeries on both the cervical and lumbar spine. Chronic right-sided neck pain.  Apparently had multiple hospitalizations before she moved to [place] having to do with failure to thrive, 50 pound weight loss, anorexia, chronic lower extremity edema.     Patient has history of colon polyps of unknown known type. She had polyps on her first colonoscopy in the 1990s. Last colonoscopy performed in [place], [place] and 3 polyps, unknown type, removed. She did not receive a letter indicating that she would require follow-up colonoscopy. EGD at the time of recent colonoscopy showed "severe" reflux symptoms and small hiatal hernia per her report. She had dysphagia at the time, this improved with esophageal dilatation. For 3-4 months patient has had almost daily, a.m. vomiting. This is not bloody.  It has not improved despite taking Prilosec 20 mg twice a day.  Appetite is depressed.  Patient had labs obtained 2/8 after office visit with the cardiologist on 2/7. Hgb was 4.9/MCV 81 and she  was advised to proceed to the emergency department. A little over a month ago hemoglobin was apparently 11.  FOBT is positive. Creatinine is slightly elevated. Albumin low at 2.3. She has been transfused with 3 PRBCs and Hgb now 9.0.  Patient has chronic constipation and manages bowel movements only every 3-4 days. A couple of weeks ago she was started on an known pill laxative, this did not relieve the constipation but she started passing dark, formed stools. This laxative was discontinued, MiraLAX and Senokot restarted and these stools turned brown again.  She did not see blood in her stools. Patient previously used ibuprofen but hasn't been taking it for 2-3 months at the orders of the physician at the assisted living.    Past Medical History  Diagnosis Date  . CHF (congestive heart failure) ([facility])   . Afib ([facility])   . Bone spur   . Arthritis   . Cancer (HCC)     carcinoma left leg  . Coronary artery disease   . Hypertension     Past Surgical History  Procedure Laterality Date  . Bypass graft    . Back surgery    . Appendectomy    . Coronary angioplasty    . Cholecystectomy    . Tonsillectomy    . Abdominal hysterectomy      partial  .  Eye surgery      bilat cataract, lasix surgery  . Breast lumpectomy Left     1980's    Prior to Admission medications   Medication Sig Start Date End Date Taking? Authorizing Provider  albuterol (PROVENTIL) (2.5 MG/3ML) 0.083% nebulizer solution Take 2.5 mg by nebulization every 6 (six) hours as needed for wheezing or shortness of breath.   Yes Historical Provider, MD  amiodarone (PACERONE) 200 MG tablet Take 200 mg by mouth daily.   Yes Historical Provider, MD  apixaban (ELIQUIS) 5 MG TABS tablet Take 5 mg by mouth 2 (two) times daily.   Yes Historical Provider, MD  aspirin 81 MG tablet Take 81 mg by mouth daily.   Yes Historical Provider, MD  atorvastatin (LIPITOR) 80 MG tablet Take 80 mg by mouth daily.   Yes Historical Provider, MD    calcium carbonate (TUMS EX) 750 MG chewable tablet Chew 1 tablet by mouth 3 (three) times daily as needed for heartburn.   Yes Historical Provider, MD  Cranberry 450 MG CAPS Take 1 capsule by mouth daily.   Yes Historical Provider, MD  cyanocobalamin 1000 MCG tablet Take 100 mcg by mouth daily.   Yes Historical Provider, MD  DULoxetine (CYMBALTA) 60 MG capsule Take 60 mg by mouth daily.   Yes Historical Provider, MD  FLUoxetine (PROZAC) 10 MG tablet Take 10 mg by mouth daily.   Yes Historical Provider, MD  furosemide (LASIX) 20 MG tablet Take 20 mg by mouth daily.   Yes Historical Provider, MD  gabapentin (NEURONTIN) 300 MG capsule Take 300 mg by mouth daily.   Yes Historical Provider, MD  Melatonin 5 MG CAPS Take 1 capsule by mouth at bedtime as needed (sleep).   Yes Historical Provider, MD  midodrine (PROAMATINE) 5 MG tablet Take 5 mg by mouth 3 (three) times daily with meals.   Yes Historical Provider, MD  mirabegron ER (MYRBETRIQ) 25 MG TB24 tablet Take 25 mg by mouth daily.   Yes Historical Provider, MD  nitroGLYCERIN (NITROSTAT) 0.4 MG SL tablet Place 0.4 mg under the tongue every 5 (five) minutes as needed for chest pain. Up to 3 doses   Yes Historical Provider, MD  omeprazole (PRILOSEC) 20 MG capsule Take 20 mg by mouth 2 (two) times daily before a meal.   Yes Historical Provider, MD  ondansetron (ZOFRAN) 4 MG tablet Take 4 mg by mouth every 6 (six) hours as needed for nausea or vomiting.   Yes Historical Provider, MD  polyethylene glycol (MIRALAX / GLYCOLAX) packet Take 17 g by mouth daily as needed (constipation).    Yes Historical Provider, MD  potassium chloride (K-DUR,KLOR-CON) 10 MEQ tablet Take 10 mEq by mouth 2 (two) times daily.   Yes Historical Provider, MD  senna (SENOKOT) 8.6 MG tablet Take 1 tablet by mouth daily as needed for constipation.   Yes Historical Provider, MD  spironolactone (ALDACTONE) 25 MG tablet Take 12.5 mg by mouth daily. Take one-half tablet (12.5mg )  daily    Yes Historical Provider, MD  traMADol (ULTRAM) 50 MG tablet Take 50 mg by mouth every 6 (six) hours as needed. Take 1-2 tablets (50-100mg ) every 4 hours as needed for pain   Yes Historical Provider, MD  traZODone (DESYREL) 50 MG tablet Take 50 mg by mouth at bedtime.   Yes Historical Provider, MD    Scheduled Meds: . pantoprazole (PROTONIX) IV  40 mg Intravenous Q12H  . sodium chloride flush  3 mL Intravenous Q12H   Infusions:  PRN Meds: ipratropium, levalbuterol, morphine injection, ondansetron **OR** ondansetron (ZOFRAN) IV   Allergies as of 07/04/2015  . (No Known Allergies)    Family History  Problem Relation Age of Onset  . Lung cancer Mother   . Heart disease Father   . Throat cancer Brother     Social History   Social History  . Marital Status: Widowed    Spouse Name: N/A  . Number of Children: N/A  . Years of Education: N/A   Occupational History  . Not on file.   Social History Main Topics  . Smoking status: Current Some Day Smoker -- 0.20 packs/day  . Smokeless tobacco: Never Used  . Alcohol Use: No  . Drug Use: No  . Sexual Activity: Not on file   Other Topics Concern  . Not on file   Social History Narrative    REVIEW OF SYSTEMS: Constitutional:  Within about 12 months, through summer of 2015, the patient lost 50 pounds. Since then she's had some increase in her weight but not more than 10 pounds. ENT:  No nose bleeds Pulm:  Periodic cough, not productive. CV:  No palpitations, no LE edema.  GU:  No hematuria, no frequency GI:  Per HPI Heme:  No previous issues with anemia, though she does take oral B12.  Transfusions:  No previous transfusions. Neuro:  No headaches, no peripheral tingling or numbness.  No history stroke or TIA  Derm:  No itching, no rash or sores.  Endocrine:  No sweats or chills.  No polyuria or dysuria Immunization:  Not queried Travel:  None beyond local counties in last few months.    PHYSICAL EXAM: Vital signs in  last 24 hours: Filed Vitals:   07/05/15 0700 07/05/15 0817  BP: 114/55 113/55  Pulse: 64 99  Temp: 98.6 F (37 C) 98 F (36.7 C)  Resp: 20 18   Wt Readings from Last 3 Encounters:  07/04/15 52.889 kg (116 lb 9.6 oz)  07/04/15 51.256 kg (113 lb)    General:  Thin, unwell appearing WF. She is comfortable. Head:  No facial asymmetry or swelling. No signs of head trauma.  Eyes:  No scleral icterus, no conjunctival pallor. Ears:  Slightly diminished hearing.  Nose:  No discharge or congestion Mouth:  Dentures in place.  Moist, clear oral mucosa. Neck:  No TMG, no JVD, no masses. Lungs:  Clear bilaterally. No dyspnea. No cough. Heart:  RRR. No MRG. S1/S2 audible. Abdomen:  Soft. Thin. Not tender or distended. Some fullness in the right abdomen but no discrete masses..   Rectal:  Deferred.   Musc/Skeltl:  Scars consistent with spinal surgery in cervical and lumbar region. Extremities:  Trace pedal/ankle edema.  Neurologic:  Oriented 3. Moves all 4 limbs, strength not tested. No tremor. No gross neurologic deficits. Skin:  Small purpura on the arms. Tattoos:  None Nodes:  No cervical adenopathy.   Psych:  Somewhat depressed but pleasant, cooperative and calm.  Intake/Output from previous day: 02/08 0701 - 02/09 0700 In: 1065 [Blood:1065] Out: 125 [Urine:125] Intake/Output this shift:    LAB RESULTS:  Recent Labs  07/04/15 1127 07/04/15 1609 07/05/15 0750  WBC 6.6 5.7 4.7  HGB 4.9* 4.7* 9.0*  HCT 16.7* 16.4* 28.3*  PLT 134* 127* 72*   BMET Lab Results  Component Value Date   NA 138 07/05/2015   NA 139 07/04/2015   K 4.0 07/05/2015   K 4.1 07/04/2015   CL 104 07/05/2015   CL  105 07/04/2015   CO2 25 07/05/2015   CO2 25 07/04/2015   GLUCOSE 81 07/05/2015   GLUCOSE 84 07/04/2015   BUN 14 07/05/2015   BUN 14 07/04/2015   CREATININE 1.03* 07/05/2015   CREATININE 1.05* 07/04/2015   CALCIUM 8.6* 07/05/2015   CALCIUM 8.7* 07/04/2015   LFT  Recent Labs   07/04/15 1609 07/05/15 0750  PROT 6.3* 6.1*  ALBUMIN 2.3* 2.3*  AST 43* 41  ALT 26 25  ALKPHOS 86 87  BILITOT 0.7 1.1   PT/INR No results found for: INR, PROTIME Hepatitis Panel No results for input(s): HEPBSAG, HCVAB, HEPAIGM, HEPBIGM in the last 72 hours. C-Diff No components found for: CDIFF Lipase  No results found for: LIPASE  Drugs of Abuse  No results found for: LABOPIA, COCAINSCRNUR, LABBENZ, AMPHETMU, THCU, LABBARB   RADIOLOGY STUDIES: No results found.  ENDOSCOPIC STUDIES: none  IMPRESSION:   *  Normocytic anemia. FOBT positive. Dark stools a couple of weeks ago. Good response to transfusion with 3 PRBCs. GI sxs include daily n/v in AM, anorexia, weight loss.  Hx GERD.    *  Thrombocytopenia.  *  Chronic eliquis for hx A fib.  NSR currently. -    PLAN:     *  EGD 1230 tomorrow. Regular diet today.  For now continue the Protonix but will switch it to oral, twice daily.   Azucena Freed  07/05/2015, 10:16 AM Pager: [phone]      Attending physician's note   I have taken a history, examined the patient and reviewed the chart. I agree with the Advanced Practitioner's note, impression and recommendations. 70 year old female with severe anemia and FOBT positive. She gives history of intermittent black stool in the past 3 weeks. We will plan for EGD tomorrow to further evaluate. The risks and benefits as well as alternatives of endoscopic procedure(s) have been discussed and reviewed. All questions answered. The patient agrees to proceed.   Damaris Hippo, MD [phone] Mon-Fri 8a-5p [phone] after 5p, weekends, holidays

## 2015-07-05 NOTE — Progress Notes (Signed)
  Echocardiogram 2D Echocardiogram has been performed.  Nicole Randall 07/05/2015, 11:08 AM

## 2015-07-05 NOTE — Progress Notes (Addendum)
PROGRESS NOTE    Nicole Randall [id number] DOB: Apr 24, 1946 DOA: 07/04/2015 PCP: No primary care provider on file.  HPI/Brief narrative 70 year old female patient, moved to the [place] area from [place], [place] approximately 2 months ago, all her previous physicians including PCP and several specialists were in the [place] area, seen by M.D. making housecall at assisted living, PMH of CAD, CABG, paroxysmal A. fib on Eliquis, multiple back (cervical and lumbar spine) surgeries, chronic neck pain, HTN, chronic systolic CHF, HLD, depression and anxiety, multiple hospitalizations, failure to thrive, significant weight loss presented to [facility] ED after being advised by her new cardiologist for evaluation of hemoglobin of 4.7 g per DL. Several week history of progressive weakness, dyspnea on exertion. Couple of weeks of melena-normal colored stools in the last week. Admitted and transfused 3 units PRBCs with improvement in hemoglobin to 9 g per DL. FOBT +. [facility] consulted. Anticoagulants and aspirin on hold.  Assessment/Plan:   1. Acute blood loss anemia, possibly complicating chronic iron deficiency anemia: Related to upper GI bleed couple weeks ago. Presented with hemoglobin of 4.7. Transfused 3 units of PRBCs and hemoglobin has improved to 9 g per DL. GI evaluation. Follow CBCs. 2. Upper GI bleed: Colonoscopy and EGD done in the last couple of years apparently showed reflux, small hiatal hernia and 3 nonmalignant polyps (per her report). [facility] consulted and plan EGD on 2/10. Continue regular diet for now and Protonix. Anticoagulants and antiplatelets on hold. 3. Thrombocytopenia:? Chronic. Platelets have dropped from 134 > 127 > 72. Follow CBCs. 4. Proximal A. fib: Currently in sinus rhythm. Continue monitoring on telemetry. Eliquis on hold secondary to problem #2. Amiodarone on hold but has long half-life. 5. Chronic systolic CHF/cardiomyopathy: 2-D echo results as below. Not sure what her  prior EF was. Compensated. Outpatient follow-up with cardiology.  6. Essential hypertension: Controlled. 7. Severe malnutrition in the context of chronic illness: Management per dietitian input.  8. Adult failure to thrive: Multifactorial. 9. Multiple back and neck surgery/chronic pain: 10. Hyperlipidemia: Statins 11. CAD status post CABG: Asymptomatic.  12. OSA: Nightly CPAP 13. Stage I sacral pressure ulcer:  WOC consultation  DVT prophylaxis: SCDs  Code Status: Full  Family Communication: None at bedside  Disposition Plan: DC home when medically stable  Consultants:  [facility]   Procedures:  2-D echo 07/05/15: Study Conclusions  - Left ventricle: The cavity size was mildly dilated. Wall thickness was normal. Systolic function was moderately to severely reduced. The estimated ejection fraction was in the range of 30% to 35%. There is akinesis of the basal-midanteroseptal and apical myocardium. The study is not technically sufficient to allow evaluation of LV diastolic function. - Aortic valve: Trileaflet; mildly thickened, mildly calcified leaflets. There was trivial regurgitation. - Mitral valve: Calcified annulus. Mildly thickened, moderately calcified leaflets . There was moderate regurgitation. - Left atrium: The atrium was severely dilated. Volume/bsa, ES (1-plane Simpson&'s, A4C): 71.1 ml/m^2. - Right ventricle: The cavity size was mildly dilated. Wall thickness was normal. - Right atrium: The atrium was severely dilated. - Tricuspid valve: There was moderate regurgitation. - Pulmonary arteries: Systolic pressure was moderately to severely increased. PA peak pressure: 63 mm Hg (S).  Antimicrobials:  None   Subjective: Normal color BM for the last week. Prior to that had couple of weeks of black stools but no overt blurred. She had been taking some laxatives for constipation. Intermittent nonbloody emesis in the mornings. Weight loss. Feels  slightly stronger. Unable to save  dyspnea better-has not been out of bed. No chest pain reported.  Objective: Filed Vitals:   07/05/15 0415 07/05/15 0430 07/05/15 0700 07/05/15 0817  BP: 104/59 111/62 114/55 113/55  Pulse: 72 80 64 99  Temp: 98.8 F (37.1 C) 98 F (36.7 C) 98.6 F (37 C) 98 F (36.7 C)  TempSrc: Oral Oral Oral Oral  Resp: 20 18 20 18   Height:      Weight:      SpO2:    90%    Intake/Output Summary (Last 24 hours) at 07/05/15 1640 Last data filed at 07/05/15 1536  Gross per 24 hour  Intake   1545 ml  Output    125 ml  Net   1420 ml   Filed Weights   07/04/15 2139  Weight: 52.889 kg (116 lb 9.6 oz)    Exam:  General exam: Small built, cachectic, chronically ill-looking elderly female lying comfortably in bed.  Respiratory system: Clear. No increased work of breathing. Cardiovascular system: S1 & S2 heard, RRR. No JVD, murmurs, gallops, clicks or pedal edema.Telemetry: Sinus rhythm.  Gastrointestinal system: Abdomen is nondistended, soft and nontender. Normal bowel sounds heard. Central nervous system: Alert and oriented. No focal neurological deficits. Extremities: Symmetric 5 x 5 power.Chronic skin changes in legs from chronic edema-hyperpigmented. No edema.   Data Reviewed: Basic Metabolic Panel:  Recent Labs Lab 07/04/15 1609 07/05/15 0750  NA 139 138  K 4.1 4.0  CL 105 104  CO2 25 25  GLUCOSE 84 81  BUN 14 14  CREATININE 1.05* 1.03*  CALCIUM 8.7* 8.6*  MG 1.7  --   PHOS 3.3  --    Liver Function Tests:  Recent Labs Lab 07/04/15 1609 07/05/15 0750  AST 43* 41  ALT 26 25  ALKPHOS 86 87  BILITOT 0.7 1.1  PROT 6.3* 6.1*  ALBUMIN 2.3* 2.3*   No results for input(s): LIPASE, AMYLASE in the last 168 hours. No results for input(s): AMMONIA in the last 168 hours. CBC:  Recent Labs Lab 07/04/15 1127 07/04/15 1609 07/05/15 0750  WBC 6.6 5.7 4.7  NEUTROABS 4.8  --  3.2  HGB 4.9* 4.7* 9.0*  HCT 16.7* 16.4* 28.3*  MCV 81.1 81.6  81.6  PLT 134* 127* 72*   Cardiac Enzymes: No results for input(s): CKTOTAL, CKMB, CKMBINDEX, TROPONINI in the last 168 hours. BNP (last 3 results) No results for input(s): PROBNP in the last 8760 hours. CBG: No results for input(s): GLUCAP in the last 168 hours.  No results found for this or any previous visit (from the past 240 hour(s)).       Studies: No results found.      Scheduled Meds: . [START ON 07/06/2015] feeding supplement (ENSURE ENLIVE)  237 mL Oral BID BM  . pantoprazole  40 mg Oral BID  . sodium chloride flush  3 mL Intravenous Q12H   Continuous Infusions:   Principal Problem:   UGI bleed Active Problems:   Symptomatic anemia   Systolic CHF (HCC)   CAD (coronary artery disease)   PAF (paroxysmal atrial fibrillation) (HCC)   Stage I pressure ulcer of sacral region   Depression   Constipation   OSA on CPAP   Hyperlipidemia   Protein-calorie malnutrition, severe    Time spent: 40 minutes.    Vernell Leep, MD, FACP, FHM. [facility] Pager [phone]  If 7PM-7AM, please contact night-coverage [email or web address] Password [email or web address] 07/05/2015, 4:40 PM    LOS: 1 day

## 2015-07-05 NOTE — Progress Notes (Signed)
Initial Nutrition Assessment  DOCUMENTATION CODES:   Severe malnutrition in context of chronic illness  INTERVENTION:  Provide Ensure Enlive po BID, each supplement provides 350 kcal and 20 grams of protein.  Monitor magnesium, potassium, and phosphorus daily for at least 3 days, MD to replete as needed, as pt is at risk for refeeding syndrome given severe malnutrition and very poor po intake over the past 3 months with n/v.  Encourage adequate PO intake.   NUTRITION DIAGNOSIS:   Malnutrition related to chronic illness as evidenced by percent weight loss, energy intake < or equal to 75% for > or equal to 1 month.  GOAL:   Patient will meet greater than or equal to 90% of their needs  MONITOR:   PO intake, Supplement acceptance, Weight trends, Labs, I & O's  REASON FOR ASSESSMENT:   Malnutrition Screening Tool    ASSESSMENT:   70 y.o. female with a past medical history of systolic CHF, CAD, carotid artery disease, paroxysmal atrial fibrillation, hypertension, hyperlipidemia, depression, anxiety who comes to the emergency department after she was called from her cardiologist's office about her results from earlier in the day labs, due to having a hemoglobin level of 4.7 g/dL. Plans for EGD tomorrow.   Meal completion has been 25%. Pt reports having a lack of appetite which has been ongoing over the past 3 months. She reports frequent nausea/vomiting mostly on a daily basis, thus unable to keep much foods down. She reports only taking bites out of her food at meals. Pt at risk for refeeding syndrome. Pt reports weight loss of 60 lbs over the past 3 months. Pt with a 34% weight loss in 3 months. Pt reports on occasion drinking Ensure however only taking sips out of it as she sometimes reports the thickness exacerbates her nausea. Pt is agreeable to Ensure while admitted to aid in caloric and protein needs. RD to order. Discussed she may have ice added to the Ensure to thin it out. Pt  encouraged to eat her food at meals. RD to continue to monitor.   Nutrition-Focused physical exam completed. Findings are moderate fat depletion, moderate muscle depletion, and no edema.   Labs and medications reviewed. Potassium, phosphorous and magnesium WNL.   Diet Order:  Diet Heart Room service appropriate?: Yes; Fluid consistency:: Thin Diet NPO time specified  Skin:  Reviewed, no issues  Last BM:  2/7  Height:   Ht Readings from Last 1 Encounters:  07/04/15 5\' 2"  (1.575 m)    Weight:   Wt Readings from Last 1 Encounters:  07/04/15 116 lb 9.6 oz (52.889 kg)    Ideal Body Weight:  50 kg  BMI:  Body mass index is 21.32 kg/(m^2).  Estimated Nutritional Needs:   Kcal:  1500-1750  Protein:  65-80 grams  Fluid:  >/= 1.5 L/day  EDUCATION NEEDS:   No education needs identified at this time  Corrin Parker, MS, RD, LDN Pager # [phone] After hours/ weekend pager # [phone]

## 2015-07-05 NOTE — Progress Notes (Signed)
Utilization review completed. Quenesha Douglass, RN, BSN. 

## 2015-07-06 ENCOUNTER — Encounter (HOSPITAL_COMMUNITY): Payer: Self-pay | Admitting: Physician Assistant

## 2015-07-06 ENCOUNTER — Inpatient Hospital Stay (HOSPITAL_COMMUNITY): Payer: MEDICARE | Admitting: Critical Care Medicine

## 2015-07-06 ENCOUNTER — Encounter (HOSPITAL_COMMUNITY)
Admission: EM | Disposition: A | Discharge status: Other Acute Inpatient Hospital | Payer: Self-pay | Source: Home / Self Care | Attending: Internal Medicine

## 2015-07-06 DIAGNOSIS — I480 Paroxysmal atrial fibrillation: Secondary | ICD-10-CM

## 2015-07-06 HISTORY — PX: ESOPHAGOGASTRODUODENOSCOPY (EGD) WITH PROPOFOL: SHX5813

## 2015-07-06 LAB — CBC WITH DIFFERENTIAL/PLATELET
BASOS ABS: 0 10*3/uL (ref 0.0–0.1)
Basophils Relative: 0 %
EOS PCT: 0 %
Eosinophils Absolute: 0 10*3/uL (ref 0.0–0.7)
HEMATOCRIT: 31.1 % — AB (ref 36.0–46.0)
HEMOGLOBIN: 9.7 g/dL — AB (ref 12.0–15.0)
LYMPHS ABS: 1.1 10*3/uL (ref 0.7–4.0)
LYMPHS PCT: 19 %
MCH: 25.9 pg — ABNORMAL LOW (ref 26.0–34.0)
MCHC: 31.2 g/dL (ref 30.0–36.0)
MCV: 83.2 fL (ref 78.0–100.0)
MONOS PCT: 14 %
Monocytes Absolute: 0.8 10*3/uL (ref 0.1–1.0)
NEUTROS ABS: 4 10*3/uL (ref 1.7–7.7)
Neutrophils Relative %: 67 %
Platelets: 92 10*3/uL — ABNORMAL LOW (ref 150–400)
RBC: 3.74 MIL/uL — ABNORMAL LOW (ref 3.87–5.11)
RDW: 18.1 % — ABNORMAL HIGH (ref 11.5–15.5)
WBC: 5.9 10*3/uL (ref 4.0–10.5)

## 2015-07-06 SURGERY — ESOPHAGOGASTRODUODENOSCOPY (EGD) WITH PROPOFOL
Anesthesia: Monitor Anesthesia Care

## 2015-07-06 MED ORDER — PROPOFOL 10 MG/ML IV BOLUS
INTRAVENOUS | Status: DC | PRN
  Administered 2015-07-06: 20 mg via INTRAVENOUS

## 2015-07-06 MED ORDER — TRAZODONE HCL 50 MG PO TABS
50.0000 mg | ORAL_TABLET | Freq: Every evening | ORAL | Status: DC | PRN
  Administered 2015-07-06 – 2015-07-08 (×3): 50 mg via ORAL
  Filled 2015-07-06 (×3): qty 1

## 2015-07-06 MED ORDER — LACTATED RINGERS IV SOLN
INTRAVENOUS | Status: DC | PRN
  Administered 2015-07-06: 13:00:00 via INTRAVENOUS

## 2015-07-06 MED ORDER — APIXABAN 5 MG PO TABS: 5.0000 mg | ORAL_TABLET | Freq: Two times a day (BID) | ORAL | Status: DC

## 2015-07-06 MED ORDER — ONDANSETRON HCL 4 MG/2ML IJ SOLN: 4.0000 mg | Freq: Once | INTRAMUSCULAR | Status: DC | PRN

## 2015-07-06 MED ORDER — FENTANYL CITRATE (PF) 100 MCG/2ML IJ SOLN
25.0000 ug | INTRAMUSCULAR | Status: DC | PRN
Start: 2015-07-06 — End: 2015-07-09

## 2015-07-06 MED ORDER — FUROSEMIDE 10 MG/ML IJ SOLN
20.0000 mg | Freq: Once | INTRAMUSCULAR | Status: AC
  Administered 2015-07-06: 20 mg via INTRAVENOUS
  Filled 2015-07-06: qty 2

## 2015-07-06 MED ORDER — PROPOFOL 500 MG/50ML IV EMUL
INTRAVENOUS | Status: DC | PRN
  Administered 2015-07-06: 150 ug/kg/min via INTRAVENOUS

## 2015-07-06 NOTE — Transfer of Care (Signed)
Immediate Anesthesia Transfer of Care Note  Patient: Nicole Randall  Procedure(s) Performed: Procedure(s): ESOPHAGOGASTRODUODENOSCOPY (EGD) WITH PROPOFOL (N/A)  Patient Location: Endoscopy Unit  Anesthesia Type:MAC  Level of Consciousness: awake, alert  and oriented  Airway & Oxygen Therapy: Patient Spontanous Breathing and Patient connected to nasal cannula oxygen  Post-op Assessment: Report given to RN and Post -op Vital signs reviewed and stable  Post vital signs: Reviewed and stable  Last Vitals:  Filed Vitals:   07/06/15 1159 07/06/15 1320  BP: 106/90 112/38  Pulse: 84 75  Temp: 36.9 C   Resp: 18 19    Complications: No apparent anesthesia complications

## 2015-07-06 NOTE — Anesthesia Procedure Notes (Signed)
Procedure Name: MAC Date/Time: 07/06/2015 1:00 PM Performed by: Merrilyn Puma B Pre-anesthesia Checklist: Patient identified, Emergency Drugs available, Timeout performed, Suction available and Patient being monitored Patient Re-evaluated:Patient Re-evaluated prior to inductionOxygen Delivery Method: Nasal cannula Placement Confirmation: positive ETCO2 and breath sounds checked- equal and bilateral Dental Injury: Teeth and Oropharynx as per pre-operative assessment

## 2015-07-06 NOTE — Consult Note (Addendum)
CARDIOLOGY CONSULT NOTE   Patient ID: Nicole Randall MRN: [id number] DOB/AGE: 1945/07/08 70 y.o.  Admit date: 07/04/2015  Primary Physician   No primary care provider on file. Primary Cardiologist   [facility]) Reason for Consultation   anticoagulation Requesting Physician Dr. Algis Liming  HPI: [name] is a 70 y.o. 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   Query has been sent via Care Everywhere. The patient is moved to assisted living facility in [place] from [place], [place] around Christmas, 2016.Multiple hospitalizations in the past year, failure to thrive, decreased appetite and weight loss of about 50 pounds since then. She is receiving home health wound care due to chronic edema, with occasional oozing blisters and erythema  Since moving to [place], he has been noted SOB and LE edema. Seen by Dr. Catha Gosselin 07/03/15 as first available. Clinic work up showed hemoglobin of 4.7 and advised to go to ER for further evaluation. Stool guiac positive. Transfused 3 units of PRBCs and hemoglobin has improved to 9. Plan for EGD today. EKG showed sinus rhythm. Echo showed LV ef of 30-35%, mildly calcified and thickened aortic valve, mildly thickened and moderately calcifed mitral valve with moderate MR, severly dilated LA, severely dilated RA, PA peak pressure of 1mm Hg. Technically difficult study to evaluate diastolic dysfunction.  Her SOB has been improved significantly. The patient denies nausea, vomiting, fever, chest pain, palpitations, orthopnea, PND, dizziness, syncope, cough,  congestion, abdominal pain. Has dar stool and LE edema.   Tobacco smoking for the past 50 years (1 pack /day), currently smoking 2 cigarettes/day. Denies drug abuse.    Past Medical History  Diagnosis Date  . CHF (congestive heart failure) ([name])   . Afib ([name])   . Bone spur   . Arthritis   . Cancer (HCC)     carcinoma left leg  . Coronary artery disease   . Hypertension      Past Surgical History  Procedure Laterality Date  . Bypass graft    . Back surgery    . Appendectomy    . Coronary angioplasty    . Cholecystectomy    . Tonsillectomy    . Abdominal hysterectomy      partial  . Eye surgery      bilat cataract, lasix surgery  . Breast lumpectomy Left     1980's    No Known Allergies  I have reviewed the patient's current medications . amiodarone  200 mg Oral Daily  . atorvastatin  80 mg Oral Daily  . feeding supplement (ENSURE ENLIVE)  237 mL Oral BID BM  . furosemide  20 mg Oral Daily  . gabapentin  300 mg Oral Daily  . midodrine  5 mg Oral TID WC  . mirabegron ER  25 mg Oral Daily  . pantoprazole  40 mg Oral BID  . potassium chloride  10 mEq Oral BID  . sodium chloride flush  3 mL Intravenous Q12H  . cyanocobalamin  100 mcg Oral Daily     acetaminophen, albuterol, morphine injection, nitroGLYCERIN, ondansetron **OR** ondansetron (ZOFRAN) IV  Prior to Admission medications   Medication Sig Start Date End Date Taking? Authorizing Provider  albuterol (PROVENTIL) (2.5 MG/3ML) 0.083% nebulizer solution Take 2.5 mg by nebulization every 6 (six) hours as needed for wheezing or shortness of breath.   Yes Historical Provider, MD  amiodarone (PACERONE) 200 MG tablet Take 200 mg by mouth daily.   Yes Historical Provider, MD  apixaban (ELIQUIS) 5 MG TABS tablet Take 5 mg by mouth 2 (two) times daily.   Yes Historical Provider, MD  aspirin 81 MG tablet Take 81 mg by mouth daily.   Yes Historical Provider, MD  atorvastatin (LIPITOR) 80 MG tablet Take 80 mg by mouth  daily.   Yes Historical Provider, MD  calcium carbonate (TUMS EX) 750 MG chewable tablet Chew 1 tablet by mouth 3 (three) times daily as needed for heartburn.   Yes Historical Provider, MD  Cranberry 450 MG CAPS Take 1 capsule by mouth daily.   Yes Historical Provider, MD  cyanocobalamin 1000 MCG tablet Take 100 mcg by mouth daily.   Yes Historical Provider, MD  DULoxetine (CYMBALTA) 60 MG capsule Take 60 mg by mouth daily.   Yes Historical Provider, MD  FLUoxetine (PROZAC) 10 MG tablet Take 10 mg by mouth daily.   Yes Historical Provider, MD  furosemide (LASIX) 20 MG tablet Take 20 mg by mouth daily.   Yes Historical Provider, MD  gabapentin (NEURONTIN) 300 MG capsule Take 300 mg by mouth daily.   Yes Historical Provider, MD  Melatonin 5 MG CAPS Take 1 capsule by mouth at bedtime as needed (sleep).   Yes Historical Provider, MD  midodrine (PROAMATINE) 5 MG tablet Take 5 mg by mouth 3 (three) times daily with meals.   Yes Historical Provider, MD  mirabegron ER (MYRBETRIQ) 25 MG TB24 tablet Take 25 mg by mouth daily.   Yes Historical Provider, MD  nitroGLYCERIN (NITROSTAT) 0.4 MG SL tablet Place 0.4 mg under the tongue every 5 (five) minutes as needed for chest pain. Up to 3 doses   Yes Historical Provider, MD  omeprazole (PRILOSEC) 20 MG capsule Take 20 mg by mouth 2 (two) times daily before a meal.   Yes Historical Provider, MD  ondansetron (ZOFRAN) 4 MG tablet Take 4 mg by mouth every 6 (six) hours as needed for nausea or vomiting.   Yes Historical Provider, MD  polyethylene glycol (MIRALAX / GLYCOLAX) packet Take 17 g by mouth daily as needed (constipation).    Yes Historical Provider, MD  potassium chloride (K-DUR,KLOR-CON) 10 MEQ tablet Take 10 mEq by mouth 2 (two) times daily.   Yes Historical Provider, MD  senna (SENOKOT) 8.6 MG tablet Take 1 tablet by mouth daily as needed for constipation.   Yes Historical Provider, MD  spironolactone (ALDACTONE) 25 MG tablet Take 12.5 mg by mouth daily.  Take one-half tablet (12.5mg )  daily   Yes Historical Provider, MD  traMADol (ULTRAM) 50 MG tablet Take 50 mg by mouth every 6 (six) hours as needed. Take 1-2 tablets (50-100mg ) every 4 hours as needed for pain   Yes Historical Provider, MD  traZODone (DESYREL) 50 MG tablet Take 50 mg by mouth at bedtime.   Yes Historical Provider, MD     Social History   Social History  . Marital Status: Widowed    Spouse Name: N/A  . Number of Children: N/A  . Years of Education: N/A   Occupational History  . Not on file.   Social History Main Topics  . Smoking status:  Current Some Day Smoker -- 0.20 packs/day  . Smokeless tobacco: Never Used  . Alcohol Use: No  . Drug Use: No  . Sexual Activity: Not on file   Other Topics Concern  . Not on file   Social History Narrative    Family Status  Relation Status Death Age  . Mother Deceased   . Father Deceased   . Brother Alive   . Maternal Grandmother Deceased   . Maternal Grandfather Deceased   . Paternal Grandmother Deceased   . Paternal Grandfather Deceased    Family History  Problem Relation Age of Onset  . Lung cancer Mother   . Heart disease Father   . Throat cancer Brother       ROS:  Full 14 point review of systems complete and found to be negative unless listed above.  Physical Exam: Blood pressure 108/44, pulse 77, temperature 98.4 F (36.9 C), temperature source Oral, resp. rate 18, height 5\' 2"  (1.575 m), weight 116 lb 2.9 oz (52.7 kg), SpO2 93 %.  General: Well developed, well nourished, female in no acute distress Head: Eyes PERRLA, No xanthomas. Normocephalic and atraumatic, oropharynx without edema or exudate.  Lungs: Resp regular and unlabored, CTA. Heart: RRR no s3, s4, or murmurs..   Neck: + carotid bruits. No lymphadenopathy.  NO JVD. Abdomen: Bowel sounds present, abdomen soft and non-tender without masses or hernias noted. Msk:  No spine or cva tenderness. No weakness, no joint deformities or  effusions. Extremities: No clubbing, cyanosis. L LE edema with oozing. 1+ DP.  Neuro: Alert and oriented X 3. No focal deficits noted. Psych:  Good affect, responds appropriately Skin: No rashes or lesions noted.  Labs:   Lab Results  Component Value Date   WBC 5.9 07/06/2015   HGB 9.7* 07/06/2015   HCT 31.1* 07/06/2015   MCV 83.2 07/06/2015   PLT 92* 07/06/2015   No results for input(s): INR in the last 72 hours.  Recent Labs Lab 07/05/15 0750  NA 138  K 4.0  CL 104  CO2 25  BUN 14  CREATININE 1.03*  CALCIUM 8.6*  PROT 6.1*  BILITOT 1.1  ALKPHOS 87  ALT 25  AST 41  GLUCOSE 81  ALBUMIN 2.3*   MAGNESIUM  Date Value Ref Range Status  07/04/2015 1.7 1.7 - 2.4 mg/dL Final   Echo: 07/05/15 LV EF: 30% -  35%  ------------------------------------------------------------------- Indications:   CHF - 428.0.  ------------------------------------------------------------------- History:  PMH: Arthritis. Cancer. Atrial fibrillation. Coronary artery disease. Risk factors: Hypertension.  ------------------------------------------------------------------- Study Conclusions  - Left ventricle: The cavity size was mildly dilated. Wall thickness was normal. Systolic function was moderately to severely reduced. The estimated ejection fraction was in the range of 30% to 35%. There is akinesis of the basal-midanteroseptal and apical myocardium. The study is not technically sufficient to allow evaluation of LV diastolic function. - Aortic valve: Trileaflet; mildly thickened, mildly calcified leaflets. There was trivial regurgitation. - Mitral valve: Calcified annulus. Mildly thickened, moderately calcified leaflets . There was moderate regurgitation. - Left atrium: The atrium was severely dilated. Volume/bsa, ES (1-plane Simpson&'s, A4C): 71.1 ml/m^2. - Right ventricle: The cavity size was mildly dilated. Wall thickness was normal. - Right atrium:  The atrium was severely dilated. - Tricuspid valve: There was moderate regurgitation. - Pulmonary arteries: Systolic pressure was moderately to severely increased. PA peak pressure: 63 mm Hg (S).  ECG:  Vent. rate 102 BPM PR interval 124 ms QRS duration 117 ms QT/QTc 385/501 ms  P-R-T axes 0 93 90  Radiology:  No results found.  ASSESSMENT AND PLAN:     1. Acute blood loss anemia  - Admitted with hemoglobin of 4.7 on Eliquis Stool guiac positive. Transfused 3 units of PRBCs and hemoglobin has improved to 9. Plan for EGD today.   2. PAF - s/p DCCV x 4 (converted to sinus rhythm 4th times with Amio). She was on eliquis for anticoagulation, now discontinued due to anemia. Need to complete workup for GI. Continue Amiodarone.  - Maintaining sinus rhythm at controlled rate.   3. CAD CABG x 2 (09/1985 & 10/1985 and multiple cath) - Unknown anatomy.  No anginal pain. Will get records.   4. Systolic Heart Failure, likely AonC - Unknown prior EF. Echo showed LV ef of 30-35%, mildly calcified and thickened aortic valve, mildly thickened and moderately calcifed mitral valve with moderate MR, severly dilated LA, severely dilated RA, PA peak pressure of 14mm Hg. Technically difficult study to evaluate diastolic dysfunction. - Continue lasix. Spironolactone on hold.   5. Carotid artery disease s/p L CEA 07/2014 and has disease in R carotid artery 6. OSA on CPAP 7. HL on statin 8. HTN- relatively stable 9. Tobacco abuse - Advise cessation. Spent at least 15 mins on education. Not interested in medication assistant. Statess "smoking as a pleasure". Needs to stop.   SignedLeanor Kail, [place] 07/06/2015, 11:39 AM Pager [phone]  Co-Sign MD  Patient seen, examined. Available data reviewed. Agree with findings, assessment, and plan as outlined by Robbie Lis, PA-C. Exam reveals an alert, oriented woman in NAD, sitting up in chair. Lungs coarse bilaterally, CV: RRR no murmur, extremities with 2+  pretibial edema. Echo shows severe LV dysfunction and patient with evidence of acute on chronic systolic heart failure. Will gently diurese. Main issue is severe anemia - I recommended to STAY OFF OF ELIQUIS until bleeding source is better defined. Appears outpatient colonoscopy/capsule endoscopy to be considered. BP too soft for ACE/beta-blocker at present. Pt on midodrine to maintain BP. Will arrange cardiology FU.   Sherren Mocha, M.D. 07/06/2015 3:40 PM

## 2015-07-06 NOTE — Progress Notes (Addendum)
PROGRESS NOTE    Nicole Randall [id number] DOB: 02/05/1946 DOA: 07/04/2015 PCP: No primary care provider on file.  HPI/Brief narrative 70 year old female patient, moved to the [place] area from [place], [place] approximately 2 months ago, all her previous physicians including PCP and several specialists were in the [place] area, seen by M.D. making housecall at assisted living, PMH of CAD, CABG, paroxysmal A. fib on Eliquis, multiple back (cervical and lumbar spine) surgeries, chronic neck pain, HTN, chronic systolic CHF, HLD, depression and anxiety, multiple hospitalizations, failure to thrive, significant weight loss presented to [facility] ED after being advised by her new cardiologist for evaluation of hemoglobin of 4.7 g per DL. Several week history of progressive weakness, dyspnea on exertion. Couple of weeks of melena-normal colored stools in the last week. Admitted and transfused 3 units PRBCs with improvement in hemoglobin to 9 g per DL. FOBT +. [facility] consulted. EGD 2/10 showed findings suggestive of gastroparesis. Discussed with GI and plan for colonoscopy on 2/13.  Assessment/Plan:   1. Acute blood loss anemia, possibly complicating chronic iron deficiency anemia: Related to upper GI bleed couple weeks ago. Presented with hemoglobin of 4.7. Transfused 3 units of PRBCs and hemoglobin has improved to 9 g per DL. GI evaluation. Hemoglobin stable in the last 24 hours. Unfortunately anemia panel was not sent prior to transfusion. 2. Upper GI bleed: Colonoscopy and EGD done in the last couple of years apparently showed reflux, small hiatal hernia and 3 nonmalignant polyps (per her report). [facility] input appreciated. EGD 2/10 shows findings suggestive of gastroparesis. Discussed with cardiology: Recommend holding off Eliquis until bleeding workup is complete and when okay to resume Eliquis, to be resumed without ASA. Discussed with GI: Will plan for colonoscopy on 2/13. 3. Thrombocytopenia:?  Chronic. Platelets have dropped from 134 > 127 > 72. Follow CBCs. Platelets are better at 92. 4. Proximal A. fib: Currently in sinus rhythm. Continue monitoring on telemetry. Eliquis was on hold and being resumed as per problem #2. Continue amiodarone. Cardiology input appreciated. Status post DCCV 4. 5. Chronic systolic CHF/cardiomyopathy: 2-D echo results as below. Not sure what her prior EF was. Compensated. Continue Lasix. Aldactone on hold. Cardiology input appreciated. 6. Essential hypertension: Controlled. 7. Severe malnutrition in the context of chronic illness: Management per dietitian input.  8. Adult failure to thrive: Multifactorial. 9. Multiple back and neck surgery/chronic pain: 10. Hyperlipidemia: Statins 11. CAD status post CABG: Asymptomatic. Cardiology following. 12. OSA: Nightly CPAP 13. Stage I sacral pressure ulcer:  WOC consultation 14. Carotid artery disease status post left CEA 15. Tobacco abuse: Cessation counseled.  DVT prophylaxis: SCDs  Code Status: Full  Family Communication: None at bedside  Disposition Plan: DC home when medically stable  Consultants:  [facility]   Procedures:  2-D echo 07/05/15: Study Conclusions  - Left ventricle: The cavity size was mildly dilated. Wall thickness was normal. Systolic function was moderately to severely reduced. The estimated ejection fraction was in the range of 30% to 35%. There is akinesis of the basal-midanteroseptal and apical myocardium. The study is not technically sufficient to allow evaluation of LV diastolic function. - Aortic valve: Trileaflet; mildly thickened, mildly calcified leaflets. There was trivial regurgitation. - Mitral valve: Calcified annulus. Mildly thickened, moderately calcified leaflets . There was moderate regurgitation. - Left atrium: The atrium was severely dilated. Volume/bsa, ES (1-plane Simpson&'s, A4C): 71.1 ml/m^2. - Right ventricle: The cavity size was  mildly dilated. Wall thickness was normal. - Right atrium: The atrium was  severely dilated. - Tricuspid valve: There was moderate regurgitation. - Pulmonary arteries: Systolic pressure was moderately to severely increased. PA peak pressure: 63 mm Hg (S).  EGD 2/10: Findings suggestive of gastroparesis.  Antimicrobials:  None   Subjective: Patient seen prior to EGD this morning. Denied complaints. Felt stronger. No BM since admission.  Objective: Filed Vitals:   07/06/15 1340 07/06/15 1345 07/06/15 1350 07/06/15 1427  BP: 103/79  119/52   Pulse: 78 77 77   Temp:      TempSrc:      Resp: 19 18 18    Height:      Weight:      SpO2: 100% 95% 93% 93%   temperature: 98.46F.  Intake/Output Summary (Last 24 hours) at 07/06/15 1626 Last data filed at 07/06/15 1458  Gross per 24 hour  Intake    560 ml  Output    150 ml  Net    410 ml   Filed Weights   07/04/15 2139 07/05/15 2105  Weight: 52.889 kg (116 lb 9.6 oz) 52.7 kg (116 lb 2.9 oz)    Exam:  General exam: Small built, cachectic, chronically ill-looking elderly female sitting up comfortably in chair this morning.  Respiratory system: Clear. No increased work of breathing. Cardiovascular system: S1 & S2 heard, RRR. No JVD, murmurs, gallops, clicks or pedal edema.Telemetry: Sinus rhythm.  Gastrointestinal system: Abdomen is nondistended, soft and nontender. Normal bowel sounds heard. Central nervous system: Alert and oriented. No focal neurological deficits. Extremities: Symmetric 5 x 5 power.Chronic skin changes in legs from chronic edema-hyperpigmented. No edema.   Data Reviewed: Basic Metabolic Panel:  Recent Labs Lab 07/04/15 1609 07/05/15 0750  NA 139 138  K 4.1 4.0  CL 105 104  CO2 25 25  GLUCOSE 84 81  BUN 14 14  CREATININE 1.05* 1.03*  CALCIUM 8.7* 8.6*  MG 1.7  --   PHOS 3.3  --    Liver Function Tests:  Recent Labs Lab 07/04/15 1609 07/05/15 0750  AST 43* 41  ALT 26 25  ALKPHOS 86 87    BILITOT 0.7 1.1  PROT 6.3* 6.1*  ALBUMIN 2.3* 2.3*   No results for input(s): LIPASE, AMYLASE in the last 168 hours. No results for input(s): AMMONIA in the last 168 hours. CBC:  Recent Labs Lab 07/04/15 1127 07/04/15 1609 07/05/15 0750 07/06/15 0543  WBC 6.6 5.7 4.7 5.9  NEUTROABS 4.8  --  3.2 4.0  HGB 4.9* 4.7* 9.0* 9.7*  HCT 16.7* 16.4* 28.3* 31.1*  MCV 81.1 81.6 81.6 83.2  PLT 134* 127* 72* 92*   Cardiac Enzymes: No results for input(s): CKTOTAL, CKMB, CKMBINDEX, TROPONINI in the last 168 hours. BNP (last 3 results) No results for input(s): PROBNP in the last 8760 hours. CBG: No results for input(s): GLUCAP in the last 168 hours.  No results found for this or any previous visit (from the past 240 hour(s)).       Studies: No results found.      Scheduled Meds: . amiodarone  200 mg Oral Daily  . apixaban  5 mg Oral BID  . atorvastatin  80 mg Oral Daily  . feeding supplement (ENSURE ENLIVE)  237 mL Oral BID BM  . furosemide  20 mg Intravenous Once  . furosemide  20 mg Oral Daily  . gabapentin  300 mg Oral Daily  . midodrine  5 mg Oral TID WC  . mirabegron ER  25 mg Oral Daily  . pantoprazole  40 mg  Oral BID  . potassium chloride  10 mEq Oral BID  . sodium chloride flush  3 mL Intravenous Q12H  . cyanocobalamin  100 mcg Oral Daily   Continuous Infusions:   Principal Problem:   UGI bleed Active Problems:   Symptomatic anemia   Systolic CHF (HCC)   CAD (coronary artery disease)   PAF (paroxysmal atrial fibrillation) (HCC)   Stage I pressure ulcer of sacral region   Depression   Constipation   OSA on CPAP   Hyperlipidemia   Protein-calorie malnutrition, severe    Time spent: 20 minutes.    Vernell Leep, MD, FACP, FHM. [facility] Pager [phone]  If 7PM-7AM, please contact night-coverage [email or web address] Password [email or web address] 07/06/2015, 4:26 PM    LOS: 2 days

## 2015-07-06 NOTE — Progress Notes (Signed)
07/06/2015  11:03 AM  Medical records requested from [facility].  Disclosure form signed.  Princella Pellegrini

## 2015-07-06 NOTE — Op Note (Addendum)
[facility] [place], [id number]   ENDOSCOPY PROCEDURE REPORT  PATIENT: Nicole Randall, Nicole Randall  MR#: [id number] BIRTHDATE: December 26, 1945 , 69  yrs. old GENDER: female ENDOSCOPIST: Harl Bowie, MD REFERRED BY:  [facility] PROCEDURE DATE:  07/06/2015 PROCEDURE:  EGD, diagnostic ASA CLASS:     Class IV INDICATIONS:  anemia and Decreased appetite, epigastric discomfort.  MEDICATIONS: Monitored anesthesia care TOPICAL ANESTHETIC: none  DESCRIPTION OF PROCEDURE: After the risks benefits and alternatives of the procedure were thoroughly explained, informed consent was obtained.  The Pentax Gastroscope [id number] endoscope was introduced through the mouth and advanced to the second portion of the duodenum , Without limitations.  The instrument was slowly withdrawn as the mucosa was fully examined.    Esophagus appeared normal .  Gastric mucosa appeared normal but large amount of retained gastric contents concerning for possible gastroparesis.  Duodenal bulb and 2 nd part of duodenum appeared normal .  No evidence of peptic ulcer or AVM.         The scope was then withdrawn from the patient and the procedure completed.  COMPLICATIONS: There were no immediate complications.  ENDOSCOPIC IMPRESSION: Findings suggestive of gastroparesis No source for possible upper GI bleed  RECOMMENDATIONS: Reglan 5mg  BID (before lunch and dinner) Will arrange for outpatient follow up visit in office and Gastric emptying study Will plan for colonoscopy on Monday to evaluate for anemia (FOBT positive) , start colon prep on Sunday   eSigned:  Harl Bowie, MD 07/06/2015 4:53 PM Revised: 07/06/2015 4:53 PM

## 2015-07-06 NOTE — Progress Notes (Signed)
Paged Dr. Silverio Decamp for diet order [phone]

## 2015-07-06 NOTE — Progress Notes (Signed)
Lasix 20 mg po lasix dc'd.  One time order Lasix 20 IV per cardiology, will re-evaluated po lasix in the am.

## 2015-07-06 NOTE — Progress Notes (Signed)
Paged Dr. Cleta Alberts for a diet order. [facility]

## 2015-07-06 NOTE — Anesthesia Preprocedure Evaluation (Addendum)
Anesthesia Evaluation  Patient identified by MRN, date of birth, ID band Patient awake    Reviewed: Allergy & Precautions, NPO status , Patient's Chart, lab work & pertinent test results  Airway Mallampati: II  TM Distance: >3 FB Neck ROM: Full    Dental   Pulmonary Current Smoker,    breath sounds clear to auscultation       Cardiovascular hypertension,  Rhythm:Regular     Neuro/Psych    GI/Hepatic   Endo/Other    Renal/GU      Musculoskeletal   Abdominal   Peds  Hematology   Anesthesia Other Findings   Reproductive/Obstetrics                            Anesthesia Physical Anesthesia Plan  ASA: III  Anesthesia Plan: MAC   Post-op Pain Management:    Induction: Intravenous  Airway Management Planned: Simple Face Mask  Additional Equipment:   Intra-op Plan:   Post-operative Plan:   Informed Consent: I have reviewed the patients History and Physical, chart, labs and discussed the procedure including the risks, benefits and alternatives for the proposed anesthesia with the patient or authorized representative who has indicated his/her understanding and acceptance.     Plan Discussed with: CRNA and Anesthesiologist  Anesthesia Plan Comments:         Anesthesia Quick Evaluation

## 2015-07-07 DIAGNOSIS — D649 Anemia, unspecified: Secondary | ICD-10-CM

## 2015-07-07 DIAGNOSIS — F329 Major depressive disorder, single episode, unspecified: Secondary | ICD-10-CM

## 2015-07-07 DIAGNOSIS — E785 Hyperlipidemia, unspecified: Secondary | ICD-10-CM

## 2015-07-07 LAB — BASIC METABOLIC PANEL
ANION GAP: 5 (ref 5–15)
BUN: 14 mg/dL (ref 6–20)
CO2: 28 mmol/L (ref 22–32)
Calcium: 8.3 mg/dL — ABNORMAL LOW (ref 8.9–10.3)
Chloride: 107 mmol/L (ref 101–111)
Creatinine, Ser: 1.19 mg/dL — ABNORMAL HIGH (ref 0.44–1.00)
GFR, EST AFRICAN AMERICAN: 52 mL/min — AB (ref >60)
GFR, EST NON AFRICAN AMERICAN: 45 mL/min — AB (ref >60)
GLUCOSE: 89 mg/dL (ref 65–99)
POTASSIUM: 3.6 mmol/L (ref 3.5–5.1)
Sodium: 140 mmol/L (ref 135–145)

## 2015-07-07 LAB — CBC WITH DIFFERENTIAL/PLATELET
BASOS ABS: 0 10*3/uL (ref 0.0–0.1)
BASOS PCT: 0 %
EOS PCT: 0 %
Eosinophils Absolute: 0 10*3/uL (ref 0.0–0.7)
HCT: 29.6 % — ABNORMAL LOW (ref 36.0–46.0)
Hemoglobin: 9 g/dL — ABNORMAL LOW (ref 12.0–15.0)
LYMPHS PCT: 25 %
Lymphs Abs: 1.4 10*3/uL (ref 0.7–4.0)
MCH: 25.4 pg — ABNORMAL LOW (ref 26.0–34.0)
MCHC: 30.4 g/dL (ref 30.0–36.0)
MCV: 83.4 fL (ref 78.0–100.0)
Monocytes Absolute: 0.7 10*3/uL (ref 0.1–1.0)
Monocytes Relative: 13 %
NEUTROS ABS: 3.5 10*3/uL (ref 1.7–7.7)
Neutrophils Relative %: 62 %
PLATELETS: 88 10*3/uL — AB (ref 150–400)
RBC: 3.55 MIL/uL — AB (ref 3.87–5.11)
RDW: 18.1 % — ABNORMAL HIGH (ref 11.5–15.5)
WBC: 5.6 10*3/uL (ref 4.0–10.5)

## 2015-07-07 MED ORDER — PEG-KCL-NACL-NASULF-NA ASC-C 100 G PO SOLR: 1.0000 | Freq: Once | ORAL | Status: DC

## 2015-07-07 MED ORDER — PEG-KCL-NACL-NASULF-NA ASC-C 100 G PO SOLR
0.5000 | Freq: Once | ORAL | Status: AC
  Administered 2015-07-08: 100 g via ORAL

## 2015-07-07 MED ORDER — PEG-KCL-NACL-NASULF-NA ASC-C 100 G PO SOLR
0.5000 | Freq: Once | ORAL | Status: AC
  Administered 2015-07-07: 100 g via ORAL
  Filled 2015-07-07: qty 1

## 2015-07-07 NOTE — Progress Notes (Signed)
Cardiology to see as needed over the weekend Please call with questions  Thompson Grayer MD, [facility] 07/07/2015 12:39 PM

## 2015-07-07 NOTE — Progress Notes (Signed)
[facility]                                                                              Patient Demographics  Nicole Randall, is a 70 y.o. female, DOB - 04-16-1946, [id number]  Admit date - 07/04/2015   Admitting Physician Reubin Milan, MD  Outpatient Primary MD for the patient is No primary care provider on file.  LOS - 3   Chief Complaint  Patient presents with  . low hgb        Brief HPI   70 year old female patient, moved to the [place] area from [place], [place] approximately 2 months ago, all her previous physicians including PCP and several specialists were in the [place] area, seen by M.D. making housecall at assisted living, PMH of CAD, CABG, paroxysmal A. fib on Eliquis, multiple back (cervical and lumbar spine) surgeries, chronic neck pain, HTN, chronic systolic CHF, HLD, depression and anxiety, multiple hospitalizations, failure to thrive, significant weight loss presented to [facility] ED after being advised by her new cardiologist for evaluation of hemoglobin of 4.7 g per DL. Several week history of progressive weakness, dyspnea on exertion. Couple of weeks of melena-normal colored stools in the last week. Admitted and transfused 3 units PRBCs with improvement in hemoglobin to 9 g per DL. FOBT +. [facility] consulted. EGD 2/10 showed findings suggestive of gastroparesis. Discussed with GI and plan for colonoscopy on 2/13.   Assessment & Plan    Principal Problem:   UGI bleed: Presenting hemoglobin of 4.7 - patient was transfused regards of packed RBCs, hemoglobin 9.0 today - GI was consulted, patient underwent EGD on 2/10 which was suggestive of gastroparesis otherwise negative. GI placed patient on Reglan.  - colonoscopy planned on 2/13 - GI and cardiology recommended holding eliquis bleeding workup is complete  Active problems Proximal A. fib:  - Currently in NSR, rate controlled, continue amiodarone, status post DCCV x4  -  Eliquis on  hold  Chronic systolic CHF/cardiomyopathy, CAD:  - 2-D echo showed EF of 30-35% akinesis of the basal mid anterior septal and apical myocardium.  -  Compensated. Continue Lasix. Aldactone on hold.  - Cardiology following.  Essential hypertension: - BP soft, hold off on ACE inhibitor, beta blocker, continue midodrine - Asymptomatic  Severe malnutrition in the context of chronic illness: Management per dietitian input.   Multiple back and neck surgery/chronic pain:  Hyperlipidemia: Statins  OSA: Nightly CPAP  Stage I sacral pressure ulcer: Wound care consult  Tobacco abuse: Cessation counseled.  Code Status: Full CODE STATUS  Family Communication: Discussed in detail with the patient, all imaging results, lab results explained to the patient    Disposition Plan: Colonoscopy on Monday  Time Spent in minutes 25 minutes  Procedures  Echo EGD  Consults   Cardiology GI  DVT Prophylaxis   SCD's  Medications  Scheduled Meds: . amiodarone  200 mg Oral Daily  . atorvastatin  80 mg Oral Daily  . feeding supplement (ENSURE ENLIVE)  237 mL Oral BID BM  . gabapentin  300 mg Oral Daily  . midodrine  5 mg Oral TID WC  . mirabegron  ER  25 mg Oral Daily  . pantoprazole  40 mg Oral BID  . peg 3350 powder  0.5 kit Oral Once   And  . [START ON 07/08/2015] peg 3350 powder  0.5 kit Oral Once  . potassium chloride  10 mEq Oral BID  . sodium chloride flush  3 mL Intravenous Q12H  . cyanocobalamin  100 mcg Oral Daily   Continuous Infusions:  PRN Meds:.acetaminophen, albuterol, fentaNYL (SUBLIMAZE) injection, morphine injection, nitroGLYCERIN, ondansetron **OR** ondansetron (ZOFRAN) IV, ondansetron (ZOFRAN) IV, traZODone   Antibiotics   Anti-infectives    None        Subjective:   Nicole Randall was seen and examined today. Some nausea but no vomiting. No bleeding.  Patient denies dizziness, chest pain, shortness of breath, abdominal pain, new weakness, numbess,  tingling. No acute events overnight.    Objective:   Blood pressure 89/43, pulse 73, temperature 98.6 F (37 C), temperature source Oral, resp. rate 18, height _0  (1.575 m), weight 52.7 kg (116 lb 2.9 oz), SpO2 94 %.  Wt Readings from Last 3 Encounters:  07/05/15 52.7 kg (116 lb 2.9 oz)  07/04/15 51.256 kg (113 lb)     Intake/Output Summary (Last 24 hours) at 07/07/15 1236 Last data filed at 07/07/15 0700  Gross per 24 hour  Intake    800 ml  Output   2100 ml  Net  -1300 ml    Exam  General: Alert and oriented x 3, NAD  HEENT:  PERRLA, EOMI, Anicteric Sclera, mucous membranes moist.   Neck: Supple, no JVD, no masses  CVS: S1 S2 auscultated, no rubs, murmurs or gallops. Regular rate and rhythm.  Respiratory: Clear to auscultation bilaterally, no wheezing, rales or rhonchi  Abdomen: Soft, nontender, nondistended, + bowel sounds  Ext: no cyanosis clubbing or edema  Neuro: AAOx3, Cr N's II- XII. Strength 5/5 upper and lower extremities bilaterally  Skin: No rashes  Psych: Normal affect and demeanor, alert and oriented x3    Data Review   Micro Results No results found for this or any previous visit (from the past 240 hour(s)).  Radiology Reports No results found.  CBC  Recent Labs Lab 07/04/15 1127 07/04/15 1609 07/05/15 0750 07/06/15 0543 07/07/15 0422  WBC 6.6 5.7 4.7 5.9 5.6  HGB 4.9* 4.7* 9.0* 9.7* 9.0*  HCT 16.7* 16.4* 28.3* 31.1* 29.6*  PLT 134* 127* 72* 92* 88*  MCV 81.1 81.6 81.6 83.2 83.4  MCH 23.8* 23.4* 25.9* 25.9* 25.4*  MCHC 29.3* 28.7* 31.8 31.2 30.4  RDW 19.7* 20.6* 17.6* 18.1* 18.1*  LYMPHSABS 0.8  --  1.0 1.1 1.4  MONOABS 0.9  --  0.5 0.8 0.7  EOSABS 0.0  --  0.0 0.0 0.0  BASOSABS 0.1  --  0.0 0.0 0.0    Chemistries   Recent Labs Lab 07/04/15 1609 07/05/15 0750 07/07/15 0422  NA 139 138 140  K 4.1 4.0 3.6  CL 105 104 107  CO2 _1 GLUCOSE 84 81 89  BUN _2 CREATININE 1.05* 1.03* 1.19*  CALCIUM 8.7*  8.6* 8.3*  MG 1.7  --   --   AST 43* 41  --   ALT 26 25  --   ALKPHOS 86 87  --   BILITOT 0.7 1.1  --    ------------------------------------------------------------------------------------------------------------------ estimated creatinine clearance is 34.8 mL/min (by C-G formula based on Cr of 1.19). ------------------------------------------------------------------------------------------------------------------ No results for input(s): HGBA1C in the last 72 hours. ------------------------------------------------------------------------------------------------------------------  No results for input(s): CHOL, HDL, LDLCALC, TRIG, CHOLHDL, LDLDIRECT in the last 72 hours. ------------------------------------------------------------------------------------------------------------------ No results for input(s): TSH, T4TOTAL, T3FREE, THYROIDAB in the last 72 hours.  Invalid input(s): FREET3 ------------------------------------------------------------------------------------------------------------------ No results for input(s): VITAMINB12, FOLATE, FERRITIN, TIBC, IRON, RETICCTPCT in the last 72 hours.  Coagulation profile No results for input(s): INR, PROTIME in the last 168 hours.  No results for input(s): DDIMER in the last 72 hours.  Cardiac Enzymes No results for input(s): CKMB, TROPONINI, MYOGLOBIN in the last 168 hours.  Invalid input(s): CK ------------------------------------------------------------------------------------------------------------------ Invalid input(s): POCBNP  No results for input(s): GLUCAP in the last 72 hours.   Dacen Frayre M.D. [facility] 07/07/2015, 12:36 PM  Pager: [phone] Between 7am to 7pm - call Pager - [phone]  After 7pm go to [email or web address] - password [email or web address]  Call night coverage person covering after 7pm

## 2015-07-07 NOTE — Anesthesia Postprocedure Evaluation (Signed)
Anesthesia Post Note  Patient: Kea Temkin  Procedure(s) Performed: Procedure(s) (LRB): ESOPHAGOGASTRODUODENOSCOPY (EGD) WITH PROPOFOL (N/A)  Patient location during evaluation: Endoscopy Anesthesia Type: MAC Level of consciousness: awake and awake and alert Pain management: pain level controlled Vital Signs Assessment: post-procedure vital signs reviewed and stable Respiratory status: nonlabored ventilation and spontaneous breathing Anesthetic complications: no    Last Vitals:  Filed Vitals:   07/07/15 0359 07/07/15 0836  BP: 96/46 89/43  Pulse: 71 73  Temp: 36.7 C 37 C  Resp: 17 18    Last Pain:  Filed Vitals:   07/07/15 1421  PainSc: Asleep                 Ladarien Beeks COKER

## 2015-07-07 NOTE — Progress Notes (Signed)
     [facility] Progress Note  Subjective:  S/P EGD 2/10: ENDOSCOPIC IMPRESSION: Findings suggestive of gastroparesis No source for possible upper GI bleed RECOMMENDATIONS: Reglan 5mg  BID (before lunch and dinner) Will arrange for outpatient follow up visit in office and Gastric emptying study Will plan for colonoscopy on Monday to evaluate for anemia (FOBT positive) , start colon prep on Sunday  Hgb this  morning 9.0. Has some nausea but has not vomited. No BM.  Objective:  Vital signs in last 24 hours: Temp:  [98 F (36.7 C)-98.6 F (37 C)] 98.6 F (37 C) (02/11 0836) Pulse Rate:  [71-84] 73 (02/11 0836) Resp:  [16-21] 18 (02/11 0836) BP: (89-135)/(38-90) 89/43 mmHg (02/11 0836) SpO2:  [93 %-100 %] 94 % (02/11 0836) FiO2 (%):  [28 %] 28 % (02/10 1427) Last BM Date: 07/03/15 General:   Alert, cachetic female in NAD Heart:  Regular rate and rhythm; no murmurs Pulm;lungs clear Abdomen:  Soft, nontender and nondistended. Normal bowel sounds, without guarding, and without rebound.   Extremities:  Without edema. Neurologic:  Alert and  oriented x4;  grossly normal neurologically.   Intake/Output from previous day: 02/10 0701 - 02/11 0700 In: 800 [P.O.:600; I.V.:200] Out: 2100 [Urine:2100] Intake/Output this shift:    Lab Results:  Recent Labs  07/05/15 0750 07/06/15 0543 07/07/15 0422  WBC 4.7 5.9 5.6  HGB 9.0* 9.7* 9.0*  HCT 28.3* 31.1* 29.6*  PLT 72* 92* 88*   BMET  Recent Labs  07/04/15 1609 07/05/15 0750 07/07/15 0422  NA 139 138 140  K 4.1 4.0 3.6  CL 105 104 107  CO2 25 25 28   GLUCOSE 84 81 89  BUN 14 14 14   CREATININE 1.05* 1.03* 1.19*  CALCIUM 8.7* 8.6* 8.3*   LFT  Recent Labs  07/05/15 0750  PROT 6.1*  ALBUMIN 2.3*  AST 41  ALT 25  ALKPHOS 72  BILITOT 1.1     ASSESSMENT/PLAN:   70 yo female with a history of CAD, CABG, paroxysmal A. fib on Ella Criss, hypertension, chronic pain, systolic CHF, hyperlipidemia, noted  to have a hemoglobin of 4.7, admitted several days ago. Patient had reported a couple weeks of melena. Transfuse 3 units of packed cells with improvement of hemoglobin. EGD yesterday showed findings suggestive of gastroparesis. Current plan is to proceed with colonoscopy on Monday. Will change patient to clear liquids today and give half of a bowel prep today, and the second half tomorrow and she will likely not tolerate a full bowel prep in one setting.   Principal Problem:   UGI bleed Active Problems:   Symptomatic anemia   Systolic CHF (HCC)   CAD (coronary artery disease)   PAF (paroxysmal atrial fibrillation) (HCC)   Stage I pressure ulcer of sacral region   Depression   Constipation   OSA on CPAP   Hyperlipidemia   Protein-calorie malnutrition, severe     LOS: 3 days   Hvozdovic, Lori P PA-C 07/07/2015, Pager [phone] Mon-Fri 8a-5p [phone] after 5p, weekends, holidays   Attending physician's note   I have taken an interval history, reviewed the chart and examined the patient. I agree with the Advanced Practitioner's note, impression and recommendations.   Damaris Hippo, MD [phone] Mon-Fri 8a-5p [phone] after 5p, weekends, holidays

## 2015-07-08 LAB — TYPE AND SCREEN
ABO/RH(D): O POS
ANTIBODY SCREEN: NEGATIVE
UNIT DIVISION: 0
UNIT DIVISION: 0
UNIT DIVISION: 0
Unit division: 0
Unit division: 0

## 2015-07-08 LAB — BASIC METABOLIC PANEL
ANION GAP: 8 (ref 5–15)
BUN: 12 mg/dL (ref 6–20)
CALCIUM: 8.5 mg/dL — AB (ref 8.9–10.3)
CO2: 27 mmol/L (ref 22–32)
CREATININE: 0.88 mg/dL (ref 0.44–1.00)
Chloride: 105 mmol/L (ref 101–111)
Glucose, Bld: 120 mg/dL — ABNORMAL HIGH (ref 65–99)
Potassium: 4.2 mmol/L (ref 3.5–5.1)
SODIUM: 140 mmol/L (ref 135–145)

## 2015-07-08 LAB — IRON AND TIBC
IRON: 28 ug/dL (ref 28–170)
Saturation Ratios: 7 % — ABNORMAL LOW (ref 10.4–31.8)
TIBC: 379 ug/dL (ref 250–450)
UIBC: 351 ug/dL

## 2015-07-08 LAB — URINALYSIS, ROUTINE W REFLEX MICROSCOPIC
BILIRUBIN URINE: NEGATIVE
GLUCOSE, UA: NEGATIVE mg/dL
HGB URINE DIPSTICK: NEGATIVE
KETONES UR: NEGATIVE mg/dL
Leukocytes, UA: NEGATIVE
Nitrite: NEGATIVE
PROTEIN: NEGATIVE mg/dL
Specific Gravity, Urine: 1.008 (ref 1.005–1.030)
pH: 7 (ref 5.0–8.0)

## 2015-07-08 LAB — CBC
HCT: 31.1 % — ABNORMAL LOW (ref 36.0–46.0)
Hemoglobin: 9.6 g/dL — ABNORMAL LOW (ref 12.0–15.0)
MCH: 26.3 pg (ref 26.0–34.0)
MCHC: 30.9 g/dL (ref 30.0–36.0)
MCV: 85.2 fL (ref 78.0–100.0)
Platelets: 86 10*3/uL — ABNORMAL LOW (ref 150–400)
RBC: 3.65 MIL/uL — ABNORMAL LOW (ref 3.87–5.11)
RDW: 18.4 % — AB (ref 11.5–15.5)
WBC: 4.9 10*3/uL (ref 4.0–10.5)

## 2015-07-08 LAB — VITAMIN B12: VITAMIN B 12: 2628 pg/mL — AB (ref 180–914)

## 2015-07-08 LAB — FOLATE: FOLATE: 19.3 ng/mL (ref >5.9)

## 2015-07-08 LAB — RETICULOCYTES
RBC.: 3.95 MIL/uL (ref 3.87–5.11)
RETIC COUNT ABSOLUTE: 75.1 10*3/uL (ref 19.0–186.0)
Retic Ct Pct: 1.9 % (ref 0.4–3.1)

## 2015-07-08 LAB — FERRITIN: Ferritin: 34 ng/mL (ref 11–307)

## 2015-07-08 NOTE — Progress Notes (Signed)
Paged NP. BP 87/42. Patient is asymptomatic.

## 2015-07-08 NOTE — Progress Notes (Signed)
Pt. placed on CPAP for evening, tolerating well, on room air.

## 2015-07-08 NOTE — Progress Notes (Signed)
Patient has refused CPAP for tonight. Patient states she can not tolerate our machine. RT will continue to monitor as needed. Patient in no distress.

## 2015-07-08 NOTE — Progress Notes (Signed)
PROGRESS NOTE    Nicole Randall BOF:[id number] DOB: 03-08-1946 DOA: 07/04/2015 PCP: No primary care provider on file.  HPI/Brief narrative 70 year old female patient, moved to the [place] area from [place], [place] approximately 2 months ago, all her previous physicians including PCP and several specialists were in the [place] area, seen by M.D. making housecall at assisted living, PMH of CAD, CABG, paroxysmal A. fib on Eliquis, multiple back (cervical and lumbar spine) surgeries, chronic neck pain, HTN, chronic systolic CHF, HLD, depression and anxiety, multiple hospitalizations, failure to thrive, significant weight loss presented to [facility] ED after being advised by her new cardiologist for evaluation of hemoglobin of 4.7 g per DL. Several week history of progressive weakness, dyspnea on exertion. Couple of weeks of melena-normal colored stools in the last week. Admitted and transfused 3 units PRBCs with improvement in hemoglobin to 9 g per DL. FOBT +. [facility] consulted. EGD 2/10 showed findings suggestive of gastroparesis. Discussed with GI and plan for colonoscopy on 2/13.  Assessment/Plan:   1. Acute blood loss anemia, possibly complicating chronic iron deficiency anemia: Related to upper GI bleed couple weeks ago. Presented with hemoglobin of 4.7. Transfused 3 units of PRBCs and hemoglobin has improved to 9 g per DL. GI evaluation. Hemoglobin stable in the 9 g per DL range for the last 4 days. Check anemia panel. 2. Upper GI bleed: Colonoscopy and EGD done in the last couple of years apparently showed reflux, small hiatal hernia and 3 nonmalignant polyps (per her report). [facility] input appreciated. EGD 2/10 shows findings suggestive of gastroparesis. Discussed with cardiology 2/10: Recommend holding off Eliquis until bleeding workup is complete and when okay to resume Eliquis, to be resumed without ASA. GI follow-up appreciated. Plan for colonoscopy 2/13. 3. Thrombocytopenia:? Chronic.  Platelets have dropped from 134 > 127 > 72. Stable in the 80s. Likely chronic. 4. Proximal A. fib: Currently in sinus rhythm. Continue monitoring on telemetry. Eliquis was on hold. Continue amiodarone. Cardiology input appreciated. Status post DCCV 4. 5. Chronic systolic CHF/cardiomyopathy: 2-D echo results as below. Not sure what her prior EF was. Compensated. Continue Lasix. Aldactone on hold. Cardiology input appreciated. 6. Essential hypertension: Controlled. 7. Severe malnutrition in the context of chronic illness: Management per dietitian input.  8. Adult failure to thrive: Multifactorial. 9. Multiple back and neck surgery/chronic pain: 10. Hyperlipidemia: Statins 11. CAD status post CABG: Asymptomatic. Cardiology following. 12. OSA: Nightly CPAP 13. Stage I sacral pressure ulcer:  WOC consultation 14. Carotid artery disease status post left CEA 15. Tobacco abuse: Cessation counseled.  DVT prophylaxis: SCDs  Code Status: Full  Family Communication: None at bedside  Disposition Plan: DC home when medically stable, could be post colonoscopy on 2/13.  Consultants:  [facility]   Cardiology   Procedures:  2-D echo 07/05/15: Study Conclusions  - Left ventricle: The cavity size was mildly dilated. Wall thickness was normal. Systolic function was moderately to severely reduced. The estimated ejection fraction was in the range of 30% to 35%. There is akinesis of the basal-midanteroseptal and apical myocardium. The study is not technically sufficient to allow evaluation of LV diastolic function. - Aortic valve: Trileaflet; mildly thickened, mildly calcified leaflets. There was trivial regurgitation. - Mitral valve: Calcified annulus. Mildly thickened, moderately calcified leaflets . There was moderate regurgitation. - Left atrium: The atrium was severely dilated. Volume/bsa, ES (1-plane Simpson&'s, A4C): 71.1 ml/m^2. - Right ventricle: The cavity size was  mildly dilated. Wall thickness was normal. - Right atrium: The atrium  was severely dilated. - Tricuspid valve: There was moderate regurgitation. - Pulmonary arteries: Systolic pressure was moderately to severely increased. PA peak pressure: 63 mm Hg (S).  EGD 2/10: Findings suggestive of gastroparesis.  Antimicrobials:  None   Subjective: Denies complaints. Undergoing bowel prep.  Objective: Filed Vitals:   07/07/15 2015 07/07/15 2130 07/08/15 0441 07/08/15 0828  BP:  120/60 87/42 100/86  Pulse: 76 67 65 64  Temp:  98.3 F (36.8 C) 98.3 F (36.8 C) 98.2 F (36.8 C)  TempSrc:  Oral Oral Oral  Resp: _0 Height:      Weight:      SpO2: 96% 100% 97% 98%    Intake/Output Summary (Last 24 hours) at 07/08/15 1541 Last data filed at 07/08/15 1415  Gross per 24 hour  Intake   2740 ml  Output    300 ml  Net   2440 ml   Filed Weights   07/04/15 2139 07/05/15 2105  Weight: 52.889 kg (116 lb 9.6 oz) 52.7 kg (116 lb 2.9 oz)    Exam:  General exam: Small built, cachectic, chronically ill-looking elderly female sitting up comfortably in chair this morning.  Respiratory system: Clear. No increased work of breathing. Cardiovascular system: S1 & S2 heard, RRR. No JVD, murmurs, gallops, clicks or pedal edema.Telemetry: PAF with controlled ventricular rate.  Gastrointestinal system: Abdomen is nondistended, soft and nontender. Normal bowel sounds heard. Central nervous system: Alert and oriented. No focal neurological deficits. Extremities: Symmetric 5 x 5 power.Chronic skin changes in legs from chronic edema-hyperpigmented. No edema.   Data Reviewed: Basic Metabolic Panel:  Recent Labs Lab 07/04/15 1609 07/05/15 0750 07/07/15 0422 07/08/15 1104  NA 139 138 140 140  K 4.1 4.0 3.6 4.2  CL 105 104 107 105  CO2 _1 GLUCOSE 84 81 89 120*  BUN _2 CREATININE 1.05* 1.03* 1.19* 0.88  CALCIUM 8.7* 8.6* 8.3* 8.5*  MG 1.7  --   --   --   PHOS  3.3  --   --   --    Liver Function Tests:  Recent Labs Lab 07/04/15 1609 07/05/15 0750  AST 43* 41  ALT 26 25  ALKPHOS 86 87  BILITOT 0.7 1.1  PROT 6.3* 6.1*  ALBUMIN 2.3* 2.3*   No results for input(s): LIPASE, AMYLASE in the last 168 hours. No results for input(s): AMMONIA in the last 168 hours. CBC:  Recent Labs Lab 07/04/15 1127 07/04/15 1609 07/05/15 0750 07/06/15 0543 07/07/15 0422 07/08/15 1104  WBC 6.6 5.7 4.7 5.9 5.6 4.9  NEUTROABS 4.8  --  3.2 4.0 3.5  --   HGB 4.9* 4.7* 9.0* 9.7* 9.0* 9.6*  HCT 16.7* 16.4* 28.3* 31.1* 29.6* 31.1*  MCV 81.1 81.6 81.6 83.2 83.4 85.2  PLT 134* 127* 72* 92* 88* 86*   Cardiac Enzymes: No results for input(s): CKTOTAL, CKMB, CKMBINDEX, TROPONINI in the last 168 hours. BNP (last 3 results) No results for input(s): PROBNP in the last 8760 hours. CBG: No results for input(s): GLUCAP in the last 168 hours.  No results found for this or any previous visit (from the past 240 hour(s)).       Studies: No results found.      Scheduled Meds: . amiodarone  200 mg Oral Daily  . atorvastatin  80 mg Oral Daily  . feeding supplement (ENSURE ENLIVE)  237 mL Oral BID BM  . gabapentin  300 mg Oral Daily  .  midodrine  5 mg Oral TID WC  . mirabegron ER  25 mg Oral Daily  . pantoprazole  40 mg Oral BID  . peg 3350 powder  0.5 kit Oral Once  . potassium chloride  10 mEq Oral BID  . sodium chloride flush  3 mL Intravenous Q12H  . cyanocobalamin  100 mcg Oral Daily   Continuous Infusions:   Principal Problem:   UGI bleed Active Problems:   Symptomatic anemia   Systolic CHF (HCC)   CAD (coronary artery disease)   PAF (paroxysmal atrial fibrillation) (HCC)   Stage I pressure ulcer of sacral region   Depression   Constipation   OSA on CPAP   Hyperlipidemia   Protein-calorie malnutrition, severe   Absolute anemia    Time spent: 20 minutes.    Vernell Leep, MD, FACP, FHM. [facility] Pager [phone]  [phone]  If 7PM-7AM, please contact night-coverage [email or web address] Password [email or web address] 07/08/2015, 3:41 PM    LOS: 4 days

## 2015-07-08 NOTE — Progress Notes (Signed)
     [facility] Progress Note  Subjective:  Took first half of movi prep last pm. Had 2 BMs this morning--started formed then became watery. Pt asks if she can sip apple juice during prep to get taste of prep out of her mouth.   Objective:  Vital signs in last 24 hours: Temp:  [98.2 F (36.8 C)-98.3 F (36.8 C)] 98.2 F (36.8 C) (02/12 0828) Pulse Rate:  [64-76] 64 (02/12 0828) Resp:  [16-18] 16 (02/12 0828) BP: (87-120)/(42-86) 100/86 mmHg (02/12 0828) SpO2:  [94 %-100 %] 98 % (02/12 0828) Last BM Date: 07/07/15 General: Alert, cachetic female in NAD Heart: Regular rate and rhythm; no murmurs Pulm;lungs clear Abdomen: Soft, nontender and nondistended. Normal bowel sounds, without guarding, and without rebound.  Extremities: Without edema. Neurologic: Alert and oriented x4; grossly normal neurologically.  Intake/Output from previous day: 02/11 0701 - 02/12 0700 In: 2140 [P.O.:2140] Out: 0  Intake/Output this shift: Total I/O In: 360 [P.O.:360] Out: 0   Lab Results:  Recent Labs  07/06/15 0543 07/07/15 0422  WBC 5.9 5.6  HGB 9.7* 9.0*  HCT 31.1* 29.6*  PLT 92* 88*   BMET  Recent Labs  07/07/15 0422  NA 140  K 3.6  CL 107  CO2 28  GLUCOSE 89  BUN 14  CREATININE 1.19*  CALCIUM 8.3*     ASSESSMENT/PLAN:  70 yo female with a history of CAD, CABG, paroxysmal A. fib on Ella Criss, hypertension, chronic pain, systolic CHF, hyperlipidemia, noted to have a hemoglobin of 4.7, admitted several days ago. Patient had reported a couple weeks of melena. Transfused 3 units of packed cells with improvement of hemoglobin.EGD Fridayshowed findings suggestive of gastroparesis.For colonoscopy tomorrow.   Principal Problem:   UGI bleed Active Problems:   Symptomatic anemia   Systolic CHF (HCC)   CAD (coronary artery disease)   PAF (paroxysmal atrial fibrillation) (HCC)   Stage I pressure ulcer of sacral region   Depression   Constipation  OSA on CPAP   Hyperlipidemia   Protein-calorie malnutrition, severe   Absolute anemia     LOS: 4 days   Bernese Doffing P PA-C 07/08/2015, Pager [phone] Mon-Fri 8a-5p [phone] after 5p, weekends, holidays

## 2015-07-09 ENCOUNTER — Encounter (HOSPITAL_COMMUNITY): Payer: Self-pay | Admitting: Anesthesiology

## 2015-07-09 ENCOUNTER — Inpatient Hospital Stay (HOSPITAL_COMMUNITY): Payer: MEDICARE | Admitting: Anesthesiology

## 2015-07-09 ENCOUNTER — Encounter (HOSPITAL_COMMUNITY)
Admission: EM | Disposition: A | Discharge status: Other Acute Inpatient Hospital | Payer: Self-pay | Source: Home / Self Care | Attending: Internal Medicine

## 2015-07-09 DIAGNOSIS — D500 Iron deficiency anemia secondary to blood loss (chronic): Secondary | ICD-10-CM

## 2015-07-09 HISTORY — PX: COLONOSCOPY WITH PROPOFOL: SHX5780

## 2015-07-09 LAB — CBC
HCT: 33.5 % — ABNORMAL LOW (ref 36.0–46.0)
HEMOGLOBIN: 9.9 g/dL — AB (ref 12.0–15.0)
MCH: 25.3 pg — ABNORMAL LOW (ref 26.0–34.0)
MCHC: 29.6 g/dL — ABNORMAL LOW (ref 30.0–36.0)
MCV: 85.5 fL (ref 78.0–100.0)
PLATELETS: 107 10*3/uL — AB (ref 150–400)
RBC: 3.92 MIL/uL (ref 3.87–5.11)
RDW: 19 % — ABNORMAL HIGH (ref 11.5–15.5)
WBC: 4.5 10*3/uL (ref 4.0–10.5)

## 2015-07-09 SURGERY — COLONOSCOPY WITH PROPOFOL
Anesthesia: Monitor Anesthesia Care

## 2015-07-09 MED ORDER — LACTATED RINGERS IV SOLN
INTRAVENOUS | Status: DC
  Administered 2015-07-09: 1000 mL via INTRAVENOUS

## 2015-07-09 MED ORDER — PROPOFOL 10 MG/ML IV BOLUS
INTRAVENOUS | Status: DC | PRN
  Administered 2015-07-09: 30 mg via INTRAVENOUS

## 2015-07-09 MED ORDER — ENSURE ENLIVE PO LIQD: 237.0000 mL | Freq: Two times a day (BID) | ORAL | Status: AC

## 2015-07-09 MED ORDER — SODIUM CHLORIDE 0.9 % IV SOLN: INTRAVENOUS | Status: DC

## 2015-07-09 MED ORDER — APIXABAN 5 MG PO TABS
5.0000 mg | ORAL_TABLET | Freq: Two times a day (BID) | ORAL | Status: DC
Start: 2015-07-09 — End: 2015-07-09

## 2015-07-09 MED ORDER — FERROUS SULFATE 325 (65 FE) MG PO TABS: 325.0000 mg | ORAL_TABLET | Freq: Two times a day (BID) | ORAL | Status: AC

## 2015-07-09 MED ORDER — PROPOFOL 500 MG/50ML IV EMUL
INTRAVENOUS | Status: DC | PRN
Start: 2015-07-09 — End: 2015-07-09
  Administered 2015-07-09: 200 ug/kg/min via INTRAVENOUS

## 2015-07-09 MED ORDER — LACTATED RINGERS IV SOLN
INTRAVENOUS | Status: DC | PRN
  Administered 2015-07-09: 09:00:00 via INTRAVENOUS

## 2015-07-09 NOTE — Interval H&P Note (Signed)
History and Physical Interval Note:  07/09/2015 8:58 AM  Nicole Randall  has presented today for surgery, with the diagnosis of anemia, melena  The various methods of treatment have been discussed with the patient and family. After consideration of risks, benefits and other options for treatment, the patient has consented to  Procedure(s): COLONOSCOPY WITH PROPOFOL (N/A) as a surgical intervention .  The patient's history has been reviewed, patient examined, no change in status, stable for surgery.  I have reviewed the patient's chart and labs.  Questions were answered to the patient's satisfaction.     Nelida Meuse III

## 2015-07-09 NOTE — Care Management Important Message (Signed)
Important Message  Patient Details  Name: Nicole Randall MRN: [id number] Date of Birth: [birth year]   Medicare Important Message Given:  Yes    Barb Merino Nikesha Kwasny 07/09/2015, 1:53 PM

## 2015-07-09 NOTE — Progress Notes (Signed)
ANTICOAGULATION CONSULT NOTE - Initial Consult  Pharmacy Consult for Apixaban  Indication:  H/o Nonvalvular Atrial Fibrillation- on Apixaban prior to admission   No Known Allergies  Patient Measurements: Height: 5\' 2"  (157.5 cm) Weight: 116 lb 2.9 oz (52.7 kg) IBW/kg (Calculated) : 50.1   Vital Signs: Temp: 97.1 F (36.2 C) (02/13 0931) Temp Source: Axillary (02/13 0931) BP: 110/54 mmHg (02/13 0940) Pulse Rate: 64 (02/13 0940)  Labs:  Recent Labs  07/07/15 0422 07/08/15 1104 07/09/15 0457  HGB 9.0* 9.6* 9.9*  HCT 29.6* 31.1* 33.5*  PLT 88* 86* 107*  CREATININE 1.19* 0.88  --     Estimated Creatinine Clearance: 47 mL/min (by C-G formula based on Cr of 0.88).   Medical History: Past Medical History  Diagnosis Date  . CHF (congestive heart failure) ([place])   . Afib ([place])   . Bone spur   . Arthritis   . Cancer (HCC)     carcinoma left leg  . Coronary artery disease   . Hypertension   . CAD (coronary artery disease)   . OSA on CPAP   . Impaired memory 2015  . Depression with anxiety 2015  . Gait disturbance 2015  . Osteoporosis 2015    Medications:  Prescriptions prior to admission  Medication Sig Dispense Refill Last Dose  . albuterol (PROVENTIL) (2.5 MG/3ML) 0.083% nebulizer solution Take 2.5 mg by nebulization every 6 (six) hours as needed for wheezing or shortness of breath.   unknown at unknown  . amiodarone (PACERONE) 200 MG tablet Take 200 mg by mouth daily.   07/04/2015 at Unknown time  . apixaban (ELIQUIS) 5 MG TABS tablet Take 5 mg by mouth 2 (two) times daily.   07/04/2015 at Unknown time  . aspirin 81 MG tablet Take 81 mg by mouth daily.   07/04/2015 at Unknown time  . atorvastatin (LIPITOR) 80 MG tablet Take 80 mg by mouth daily.   07/04/2015 at Unknown time  . calcium carbonate (TUMS EX) 750 MG chewable tablet Chew 1 tablet by mouth 3 (three) times daily as needed for heartburn.   unknown at unknown  . Cranberry 450 MG CAPS Take 1 capsule by mouth  daily.   07/04/2015 at Unknown time  . cyanocobalamin 1000 MCG tablet Take 100 mcg by mouth daily.   07/04/2015 at Unknown time  . DULoxetine (CYMBALTA) 60 MG capsule Take 60 mg by mouth daily.   07/04/2015 at Unknown time  . FLUoxetine (PROZAC) 10 MG tablet Take 10 mg by mouth daily.   07/04/2015 at Unknown time  . furosemide (LASIX) 20 MG tablet Take 20 mg by mouth daily.   07/04/2015 at Unknown time  . gabapentin (NEURONTIN) 300 MG capsule Take 300 mg by mouth daily.   07/04/2015 at Unknown time  . Melatonin 5 MG CAPS Take 1 capsule by mouth at bedtime as needed (sleep).   07/03/2015 at Unknown time  . midodrine (PROAMATINE) 5 MG tablet Take 5 mg by mouth 3 (three) times daily with meals.   07/04/2015 at Unknown time  . mirabegron ER (MYRBETRIQ) 25 MG TB24 tablet Take 25 mg by mouth daily.   07/04/2015 at Unknown time  . nitroGLYCERIN (NITROSTAT) 0.4 MG SL tablet Place 0.4 mg under the tongue every 5 (five) minutes as needed for chest pain. Up to 3 doses   unknown at unknown  . omeprazole (PRILOSEC) 20 MG capsule Take 20 mg by mouth 2 (two) times daily before a meal.   07/04/2015 at Unknown time  .  ondansetron (ZOFRAN) 4 MG tablet Take 4 mg by mouth every 6 (six) hours as needed for nausea or vomiting.   07/03/2015 at Unknown time  . polyethylene glycol (MIRALAX / GLYCOLAX) packet Take 17 g by mouth daily as needed (constipation).    07/04/2015 at Unknown time  . potassium chloride (K-DUR,KLOR-CON) 10 MEQ tablet Take 10 mEq by mouth 2 (two) times daily.   07/04/2015 at Unknown time  . senna (SENOKOT) 8.6 MG tablet Take 1 tablet by mouth daily as needed for constipation.   unknown at unknown  . spironolactone (ALDACTONE) 25 MG tablet Take 12.5 mg by mouth daily. Take one-half tablet (12.5mg )  daily   07/04/2015 at Unknown time  . traMADol (ULTRAM) 50 MG tablet Take 50 mg by mouth every 6 (six) hours as needed. Take 1-2 tablets (50-100mg ) every 4 hours as needed for pain   unknown at unknown  . traZODone (DESYREL) 50 MG  tablet Take 50 mg by mouth at bedtime.   07/03/2015 at Unknown time   Scheduled:  . amiodarone  200 mg Oral Daily  . atorvastatin  80 mg Oral Daily  . feeding supplement (ENSURE ENLIVE)  237 mL Oral BID BM  . gabapentin  300 mg Oral Daily  . midodrine  5 mg Oral TID WC  . mirabegron ER  25 mg Oral Daily  . pantoprazole  40 mg Oral BID  . potassium chloride  10 mEq Oral BID  . sodium chloride flush  3 mL Intravenous Q12H  . cyanocobalamin  100 mcg Oral Daily    Assessment: 70 y.o female on Apixaban prior to admission, 5 mg po BID for h/o nonvalvular afib, last taken on 07/04/15.  She was admitted from SNF on 2/8 with critical low Hgb of 4.7, weakness, dizziness, and hypotension. The patient is s/p colonoscopy today 2/13. Results: terminal ileum was normal and there was moderate diverticulosis in left colon.  GI has recommended that anticoagulation can be resumed today.   SCr 0.88, Hgb 9.9, pltc 107K (86K>107K). Weight 52.7kg.   Goal of Therapy:  Monitor platelets by anticoagulation protocol: Yes   Plan:  Resume Apixaban 5 mg po BID Monitor for signs and sx's of bleeding    Nicole Cella, RPh Clinical Pharmacist Pager: [phone] 07/09/2015,11:29 AM

## 2015-07-09 NOTE — Care Management Note (Addendum)
Case Management Note  Patient Details  Name: [name] MRN: [id number] Date of Birth: [birth year]  Subjective/Objective:               CM following for progression and d/c planning.     Action/Plan: 07/09/2015 Met with pt to discuss d/c needs, pt states that [facility] comes to the ALF every Wednesday, she wishes to have her own PCP.  This CM spoke with pt daughterLamont Snowball @ [phone] re d/c plans. [name] Seen has spoke with [facility] earlier and they have agreed to accept this pt as a new pt and will see her for a first visit on August 29, 2015. In the interim this pt will be seen by [facility] at the [facility] on Wednesdays. [facility] are aware that pt will need a hospital followup visit.  Pt active with Alvis Lemmings for [facility] and HHPT, this CM contacted [facility] rep re resumption of orders.  Expected Discharge Date:  07/09/15               Expected Discharge Plan:  Assisted Living / Rest Home  In-House Referral:  Clinical Social Work  Discharge planning Services  Follow-up appt scheduled  Post Acute Care Choice:  NA Choice offered to:  NA  DME Arranged:    DME Agency:     HH Arranged:  HHRN and HHPT HH Agency:   [facility]  Status of Service:  Completed, signed off  Medicare Important Message Given:    Date Medicare IM Given:    Medicare IM give by:    Date Additional Medicare IM Given:    Additional Medicare Important Message give by:     If discussed at [place] of Stay Meetings, dates discussed:    Additional Comments:  Adron Bene, RN 07/09/2015, 11:09 AM

## 2015-07-09 NOTE — Clinical Social Work Note (Addendum)
Patient will discharge today per MD order. Patient will discharge to: [facility] (return) RN to call report prior to transportation to: [phone] Transportation: patient's son-in-law will be transporting the patient to ALF at RN and family convenience  CSW sent discharge summary to SNF for review.  CSW met with patient and ALF liaison at bedside.  CSW gave liaison dc summary and FL2. Patient agreeable to discharge plans.  Nonnie Done, LCSW [phone]  [id number], 2S 15-16 and Psychiatric Service Line  Licensed Clinical Social Worker

## 2015-07-09 NOTE — Discharge Summary (Addendum)
Physician Discharge Summary  Nicole Randall  [id number]  DOB: 06/15/1945  DOA: 07/04/2015  PCP: No primary care provider on file.  Admit date: 07/04/2015 Discharge date: 07/09/2015  Time spent: Greater than 30 minutes  Recommendations for Outpatient Follow-up:  1. Doctors making house calls/PCP on 07/11/15. To be seen with repeat labs (CBC & BMP). 2. Dr. Eulas Post, [facility] on 08/29/15-first visit to establish care with new PCP. 3. Dr. Harl Bowie, [facility] on 09/14/15 at 49 AM. 4. Dr. Constance Haw, Cardiology in 2 weeks. ALF to arrange follow-up.  Discharge Diagnoses:  Principal Problem:   UGI bleed Active Problems:   Symptomatic anemia   Systolic CHF (HCC)   CAD (coronary artery disease)   PAF (paroxysmal atrial fibrillation) (HCC)   Stage I pressure ulcer of sacral region   Depression   Constipation   OSA on CPAP   Hyperlipidemia   Protein-calorie malnutrition, severe   Absolute anemia   Discharge Condition: Improved & Stable  Diet recommendation: Heart healthy and diabetic diet.  Filed Weights   07/04/15 2139 07/05/15 2105  Weight: 52.889 kg (116 lb 9.6 oz) 52.7 kg (116 lb 2.9 oz)    History of present illness:  70 year old female patient, moved to the [place] area from [place], [place] approximately 2 months ago, all her previous physicians including PCP and several specialists were in the [place] area, seen by M.D. making housecall at assisted living, PMH of CAD, CABG, paroxysmal A. fib on Eliquis, multiple back (cervical and lumbar spine) surgeries, chronic neck pain, HTN, chronic systolic CHF, HLD, depression and anxiety, multiple hospitalizations, failure to thrive, significant weight loss presented to [facility] ED after being advised by her new cardiologist for evaluation of hemoglobin of 4.7 g per DL. Several week history of progressive weakness, dyspnea on exertion. Couple of weeks of melena-normal colored stools in the last week. Admitted and  transfused 3 units PRBCs with improvement in hemoglobin to 9 g per DL. FOBT +. [facility] consulted. EGD 2/10 showed findings suggestive of gastroparesis. Colonoscopy 2/13 showed moderate diverticulosis of left colon but no overt source of bleeding.  Hospital Course:    1. Acute blood loss anemia, complicating chronic iron deficiency anemia: Related to upper GI bleed couple weeks ago. Presented with hemoglobin of 4.7. Transfused 3 units of PRBCs and hemoglobin has improved to 9 g per DL. GI evaluation. Hemoglobin stable in the 9 g per DL range for the last 5 days. Colonoscopy today showed moderate diverticulosis of the left colon without obvious source of bleeding. GI has cleared her to resume anticoagulation for Afib. Anemia panel confirms iron deficiency (iron 28, TIBC 379, saturation pressure 7, ferritin 34, folate 19.3, B12: 2628 & reticulocyte count 75). We will start iron supplements at discharge and patient has been advised that her stools are likely to turn dark from iron supplements. 2. Upper GI bleed: Colonoscopy and EGD done in the last couple of years apparently showed reflux, small hiatal hernia and 3 nonmalignant polyps (per her report). [facility] input appreciated. EGD 2/10 shows findings suggestive of gastroparesis. Colonoscopy results as below. As discussed with cardiology, DC ASA at DC. 3. Thrombocytopenia:? Chronic. Platelets have dropped from 134 > 127 > 72. Likely chronic. Platelet has improved to 107. Outpatient follow-up.  4. Proximal A. fib: Eliquis was on hold. Continue amiodarone. Cardiology input appreciated. Status post DCCV 4. Resume Eliquis but DC ASA as DC. 5. Chronic systolic CHF/cardiomyopathy: 2-D echo results as below. Not sure what her prior  EF was (per daughter, it seems about the same as before). Compensated. Continue Lasix & Aldactone. Cardiology input appreciated. Unable to add ACEI/ARB, nitrates or hydralazine due to issues with hypotension (patient is on  midodrine) 6. Essential hypertension: Controlled. History of chronic hypotension and on midodrine for same.  7. Severe malnutrition in the context of chronic illness: Management per dietitian input.  8. Adult failure to thrive: Multifactorial. 9. Multiple back and neck surgery/chronic pain: 10. Hyperlipidemia: Statins 11. CAD status post CABG: Asymptomatic.  12. OSA: Nightly CPAP 13. Stage I sacral pressure ulcer: WOC consultation 14. Carotid artery disease status post left CEA 15. Tobacco abuse: Cessation counseled. 16. Depression: Patient was on polypharmacy prior to admission. Unclear as to how long she's been on these medications. Follow-up as outpatient and consider if she needs all of these or some of them can be discontinued. Stable.  Discussed with patient's daughter Ms. Lamont Snowball. Updated care and answered questions.   Consultants:  [facility]   Cardiology   Procedures:  2-D echo 07/05/15: Study Conclusions  - Left ventricle: The cavity size was mildly dilated. Wall thickness was normal. Systolic function was moderately to severely reduced. The estimated ejection fraction was in the range of 30% to 35%. There is akinesis of the basal-midanteroseptal and apical myocardium. The study is not technically sufficient to allow evaluation of LV diastolic function. - Aortic valve: Trileaflet; mildly thickened, mildly calcified leaflets. There was trivial regurgitation. - Mitral valve: Calcified annulus. Mildly thickened, moderately calcified leaflets . There was moderate regurgitation. - Left atrium: The atrium was severely dilated. Volume/bsa, ES (1-plane Simpson&'s, A4C): 71.1 ml/m^2. - Right ventricle: The cavity size was mildly dilated. Wall thickness was normal. - Right atrium: The atrium was severely dilated. - Tricuspid valve: There was moderate regurgitation. - Pulmonary arteries: Systolic pressure was moderately to severely increased.  PA peak pressure: 63 mm Hg (S).  EGD 2/10: Findings suggestive of gastroparesis.  Colonoscopy 07/09/15: ENDOSCOPIC IMPRESSION: There was moderate diverticulosis noted in the left colon no source of anemia was found  RECOMMENDATIONS: #1 the patient's anticoagulation can be resumed today #2 please discuss with her cardiologist how strongly she needs aspirin #3 the patient can be discharged home from a GI perspective to plan an outpatient small bowel video capsule study. #4 ) follow up with PCP to check hemoglobin #5 oral iron supplements ( inform patient that I will turn her stool dark)  Antimicrobials:  None   Discharge Exam:  Complaints:  Seen after colonoscopy. Denies complaints. No weakness, dizziness or lightheadedness.  Filed Vitals:   07/09/15 0543 07/09/15 0829 07/09/15 0931 07/09/15 0940  BP: 107/41 145/62 104/40 110/54  Pulse: 67 77 62 64  Temp: 98.2 F (36.8 C) 98.7 F (37.1 C) 97.1 F (36.2 C)   TempSrc: Oral Oral Axillary   Resp: 18 16 20 14   Height:      Weight:      SpO2: 95% 98% 100% 100%    General exam: Small built, cachectic, chronically ill-looking elderly female sitting up comfortably at edge of bed this morning. Respiratory system: Clear. No increased work of breathing. Cardiovascular system: S1 & S2 heard, RRR. No JVD, murmurs, gallops, clicks or pedal edema. Gastrointestinal system: Abdomen is nondistended, soft and nontender. Normal bowel sounds heard. Central nervous system: Alert and oriented. No focal neurological deficits. Extremities: Symmetric 5 x 5 power.Chronic skin changes in legs from chronic edema-hyperpigmented. No edema.  Discharge Instructions      Discharge Instructions    (  HEART FAILURE PATIENTS) Call MD:  Anytime you have any of the following symptoms: 1) 3 pound weight gain in 24 hours or 5 pounds in 1 week 2) shortness of breath, with or without a dry hacking cough 3) swelling in the hands, feet or stomach 4) if you have  to sleep on extra pillows at night in order to breathe.    Complete by:  As directed      Call MD for:  difficulty breathing, headache or visual disturbances    Complete by:  As directed      Call MD for:  extreme fatigue    Complete by:  As directed      Call MD for:  persistant dizziness or light-headedness    Complete by:  As directed      Call MD for:  persistant nausea and vomiting    Complete by:  As directed      Call MD for:  severe uncontrolled pain    Complete by:  As directed      Call MD for:  temperature >100.4    Complete by:  As directed      Diet - low sodium heart healthy    Complete by:  As directed      Increase activity slowly    Complete by:  As directed             Medication List    STOP taking these medications        aspirin 81 MG tablet      TAKE these medications        albuterol (2.5 MG/3ML) 0.083% nebulizer solution  Commonly known as:  PROVENTIL  Take 2.5 mg by nebulization every 6 (six) hours as needed for wheezing or shortness of breath.     amiodarone 200 MG tablet  Commonly known as:  PACERONE  Take 200 mg by mouth daily.     atorvastatin 80 MG tablet  Commonly known as:  LIPITOR  Take 80 mg by mouth daily.     calcium carbonate 750 MG chewable tablet  Commonly known as:  TUMS EX  Chew 1 tablet by mouth 3 (three) times daily as needed for heartburn.     Cranberry 450 MG Caps  Take 1 capsule by mouth daily.     cyanocobalamin 1000 MCG tablet  Take 100 mcg by mouth daily.     DULoxetine 60 MG capsule  Commonly known as:  CYMBALTA  Take 60 mg by mouth daily.     ELIQUIS 5 MG Tabs tablet  Generic drug:  apixaban  Take 5 mg by mouth 2 (two) times daily.     feeding supplement (ENSURE ENLIVE) Liqd  Take 237 mLs by mouth 2 (two) times daily between meals.     ferrous sulfate 325 (65 FE) MG tablet  Take 1 tablet (325 mg total) by mouth 2 (two) times daily with a meal.     FLUoxetine 10 MG tablet  Commonly known as:  PROZAC   Take 10 mg by mouth daily.     furosemide 20 MG tablet  Commonly known as:  LASIX  Take 20 mg by mouth daily.     gabapentin 300 MG capsule  Commonly known as:  NEURONTIN  Take 300 mg by mouth daily.     Melatonin 5 MG Caps  Take 1 capsule by mouth at bedtime as needed (sleep).     midodrine 5 MG tablet  Commonly known as:  PROAMATINE  Take 5 mg by mouth 3 (three) times daily with meals.     MYRBETRIQ 25 MG Tb24 tablet  Generic drug:  mirabegron ER  Take 25 mg by mouth daily.     nitroGLYCERIN 0.4 MG SL tablet  Commonly known as:  NITROSTAT  Place 0.4 mg under the tongue every 5 (five) minutes as needed for chest pain. Up to 3 doses     omeprazole 20 MG capsule  Commonly known as:  PRILOSEC  Take 20 mg by mouth 2 (two) times daily before a meal.     ondansetron 4 MG tablet  Commonly known as:  ZOFRAN  Take 4 mg by mouth every 6 (six) hours as needed for nausea or vomiting.     polyethylene glycol packet  Commonly known as:  MIRALAX / GLYCOLAX  Take 17 g by mouth daily as needed (constipation).     potassium chloride 10 MEQ tablet  Commonly known as:  K-DUR,KLOR-CON  Take 10 mEq by mouth 2 (two) times daily.     senna 8.6 MG tablet  Commonly known as:  SENOKOT  Take 1 tablet by mouth daily as needed for constipation.     spironolactone 25 MG tablet  Commonly known as:  ALDACTONE  Take 12.5 mg by mouth daily. Take one-half tablet (12.5mg )  daily     traMADol 50 MG tablet  Commonly known as:  ULTRAM  Take 50 mg by mouth every 6 (six) hours as needed. Take 1-2 tablets (50-100mg ) every 4 hours as needed for pain     traZODone 50 MG tablet  Commonly known as:  DESYREL  Take 50 mg by mouth at bedtime.       Follow-up Information    Follow up with Harl Bowie, MD On 09/14/2015.   Specialty:  Gastroenterology   Why:  11 AM follow up with gastrointestinal doctor.    Contact information:   [place] [address] [phone]        Please follow up.   Why:  [place], will see this pt at her ALF on Wed. Jul 11, 2015 for hospital followup (to be seen with repeat labs - CBC & BMP) and will continue to see her on Wednesdays as needed. .      Follow up with [facility].   Why:  August 29, 2015 for first visit to establish as PCP, Dr Eulas Post.    Contact information:   [place] [phone]      Follow up with Will Meredith Leeds, MD. Schedule an appointment as soon as possible for a visit in 2 weeks.   Specialty:  Cardiology   Contact information:   [place] [address] [phone]       Get Medicines reviewed and adjusted: Please take all your medications with you for your next visit with your Primary MD  Please request your Primary MD to go over all hospital tests and procedure/radiological results at the follow up. Please ask your Primary MD to get all Hospital records sent to his/her office.  If you experience worsening of your admission symptoms, develop shortness of breath, life threatening emergency, suicidal or homicidal thoughts you must seek medical attention immediately by calling 911 or calling your MD immediately if symptoms less severe.  You must read complete instructions/literature along with all the possible adverse reactions/side effects for all the Medicines you take and that have been  prescribed to you. Take any new Medicines after you have completely understood and accept all the possible adverse reactions/side effects.   Do not drive when taking pain medications.   Do not take more than prescribed Pain, Sleep and Anxiety Medications  Special Instructions: If you have smoked or chewed Tobacco in the last 2 yrs please stop smoking, stop any regular Alcohol and or any Recreational drug use.  Wear Seat belts while driving.  Please note  You were cared for by a hospitalist during your hospital stay.  Once you are discharged, your primary care physician will handle any further medical issues. Please note that NO REFILLS for any discharge medications will be authorized once you are discharged, as it is imperative that you return to your primary care physician (or establish a relationship with a primary care physician if you do not have one) for your aftercare needs so that they can reassess your need for medications and monitor your lab values.    The results of significant diagnostics from this hospitalization (including imaging, microbiology, ancillary and laboratory) are listed below for reference.    Significant Diagnostic Studies: No results found.  Microbiology: No results found for this or any previous visit (from the past 240 hour(s)).   Labs: Basic Metabolic Panel:  Recent Labs Lab 07/04/15 1609 07/05/15 0750 07/07/15 0422 07/08/15 1104  NA 139 138 140 140  K 4.1 4.0 3.6 4.2  CL 105 104 107 105  CO2 25 25 28 27   GLUCOSE 84 81 89 120*  BUN 14 14 14 12   CREATININE 1.05* 1.03* 1.19* 0.88  CALCIUM 8.7* 8.6* 8.3* 8.5*  MG 1.7  --   --   --   PHOS 3.3  --   --   --    Liver Function Tests:  Recent Labs Lab 07/04/15 1609 07/05/15 0750  AST 43* 41  ALT 26 25  ALKPHOS 86 87  BILITOT 0.7 1.1  PROT 6.3* 6.1*  ALBUMIN 2.3* 2.3*   No results for input(s): LIPASE, AMYLASE in the last 168 hours. No results for input(s): AMMONIA in the last 168 hours. CBC:  Recent Labs Lab 07/04/15 1127  07/05/15 0750 07/06/15 0543 07/07/15 0422 07/08/15 1104 07/09/15 0457  WBC 6.6  < > 4.7 5.9 5.6 4.9 4.5  NEUTROABS 4.8  --  3.2 4.0 3.5  --   --   HGB 4.9*  < > 9.0* 9.7* 9.0* 9.6* 9.9*  HCT 16.7*  < > 28.3* 31.1* 29.6* 31.1* 33.5*  MCV 81.1  < > 81.6 83.2 83.4 85.2 85.5  PLT 134*  < > 72* 92* 88* 86* 107*  < > = values in this interval not displayed. Cardiac Enzymes: No results for input(s): CKTOTAL, CKMB, CKMBINDEX, TROPONINI in the last 168 hours. BNP: BNP (last 3  results) No results for input(s): BNP in the last 8760 hours.  ProBNP (last 3 results) No results for input(s): PROBNP in the last 8760 hours.  CBG: No results for input(s): GLUCAP in the last 168 hours.     Signed:  Vernell Leep, MD, FACP, FHM. [facility] Pager [phone]  If 7PM-7AM, please contact night-coverage [email or web address] Password [facility] 07/09/2015, 11:58 AM

## 2015-07-09 NOTE — Progress Notes (Signed)
Nutrition Follow-up  DOCUMENTATION CODES:   Severe malnutrition in context of chronic illness  INTERVENTION:  Encourage continuation of Ensure supplementation at assisted living.  Pt to be discharged today.  NUTRITION DIAGNOSIS:   Malnutrition related to chronic illness as evidenced by percent weight loss, energy intake < or equal to 75% for > or equal to 1 month; ongoing  GOAL:   Patient will meet greater than or equal to 90% of their needs; not met  MONITOR:   PO intake, Supplement acceptance, Weight trends, Labs, I & O's  REASON FOR ASSESSMENT:   Malnutrition Screening Tool    ASSESSMENT:   70 y.o. female with a past medical history of systolic CHF, CAD, carotid artery disease, paroxysmal atrial fibrillation, hypertension, hyperlipidemia, depression, anxiety who comes to the emergency department after she was called from her cardiologist's office about her results from earlier in the day labs, due to having a hemoglobin level of 4.7 g/dL.  Pt underwent colonoscopy this AM. Diet advanced to a regular diet. Pt reports no abdominal pains during time of visit and ready for lunch. Meal completion at lunch time was 25%. Previous meal completions on clear liquid diet was 100%. Plans for discharge today. Encourage continuation of nutritional supplementation at assisted living.   Labs and medications reviewed.   Diet Order:  Diet regular Room service appropriate?: Yes; Fluid consistency:: Thin Diet - low sodium heart healthy  Skin:  Wound (see comment) (Stage I pressure ulcer on sacrum)  Last BM:  2/11  Height:   Ht Readings from Last 1 Encounters:  07/04/15 5' 2"  (1.575 m)    Weight:   Wt Readings from Last 1 Encounters:  07/05/15 116 lb 2.9 oz (52.7 kg)    Ideal Body Weight:  50 kg  BMI:  Body mass index is 21.24 kg/(m^2).  Estimated Nutritional Needs:   Kcal:  1500-1750  Protein:  65-80 grams  Fluid:  >/= 1.5 L/day  EDUCATION NEEDS:   No education  needs identified at this time  Corrin Parker, MS, RD, LDN Pager # [phone] After hours/ weekend pager # [phone]

## 2015-07-09 NOTE — NC FL2 (Signed)
[place] LEVEL OF CARE SCREENING TOOL     IDENTIFICATION  Patient Name: Nicole Randall Birthdate: [birth year] Sex: female Admission Date (Current Location): 07/04/2015  [facility] and [place] Number:  Herbalist and Address:  [facility] [place] [facility] [address] [place], [place] [address]      Provider Number: [id number]  Attending Physician Name and Address:  Modena Jansky, MD  Relative Name and Phone Number:       Current Level of Care: Hospital Recommended Level of Care: [place] Prior Approval Number:    Date Approved/Denied:   PASRR Number:    Discharge Plan:  (return to ALF)    Current Diagnoses: Patient Active Problem List   Diagnosis Date Noted  . Absolute anemia   . Protein-calorie malnutrition, severe 07/05/2015  . UGI bleed 07/04/2015  . Symptomatic anemia 07/04/2015  . Systolic CHF ([place]) [id number]  . HTN (hypertension) 07/04/2015  . CAD (coronary artery disease) 07/04/2015  . PAF (paroxysmal atrial fibrillation) ([place]) 07/04/2015  . Stage I pressure ulcer of sacral region 07/04/2015  . Depression 07/04/2015  . Constipation 07/04/2015  . OSA on CPAP 07/04/2015  . Hyperlipidemia 07/04/2015    Orientation RESPIRATION BLADDER Height & Weight     Self, Time, Situation, Place  Normal Continent Weight: 116 lb 2.9 oz (52.7 kg) Height:  5\' 2"  (157.5 cm)  BEHAVIORAL SYMPTOMS/MOOD NEUROLOGICAL BOWEL NUTRITION STATUS      Continent  (please see dc summary for dietary needs at time of discharge)  AMBULATORY STATUS COMMUNICATION OF NEEDS Skin     Verbally PU Stage and Appropriate Care                       Personal Care Assistance Level of Assistance              Functional Limitations Info             SPECIAL CARE FACTORS FREQUENCY                       Contractures Contractures Info: Not present    Additional Factors Info                  Discharge  Medications: albuterol (2.5 MG/3ML) 0.083% nebulizer solution  Commonly known as: PROVENTIL  Take 2.5 mg by nebulization every 6 (six) hours as needed for wheezing or shortness of breath.      amiodarone 200 MG tablet  Commonly known as: PACERONE  Take 200 mg by mouth daily.     atorvastatin 80 MG tablet  Commonly known as: LIPITOR  Take 80 mg by mouth daily.     calcium carbonate 750 MG chewable tablet  Commonly known as: TUMS EX  Chew 1 tablet by mouth 3 (three) times daily as needed for heartburn.     Cranberry 450 MG Caps  Take 1 capsule by mouth daily.     cyanocobalamin 1000 MCG tablet  Take 100 mcg by mouth daily.     DULoxetine 60 MG capsule  Commonly known as: CYMBALTA  Take 60 mg by mouth daily.     ELIQUIS 5 MG Tabs tablet  Generic drug: apixaban  Take 5 mg by mouth 2 (two) times daily.     feeding supplement (ENSURE ENLIVE) Liqd  Take 237 mLs by mouth 2 (two) times daily between meals.     ferrous sulfate 325 (65 FE) MG tablet  Take 1 tablet (325 mg total) by mouth 2 (two) times daily with a meal.     FLUoxetine 10 MG tablet  Commonly known as: PROZAC  Take 10 mg by mouth daily.     furosemide 20 MG tablet  Commonly known as: LASIX  Take 20 mg by mouth daily.     gabapentin 300 MG capsule  Commonly known as: NEURONTIN  Take 300 mg by mouth daily.     Melatonin 5 MG Caps  Take 1 capsule by mouth at bedtime as needed (sleep).     midodrine 5 MG tablet  Commonly known as: PROAMATINE  Take 5 mg by mouth 3 (three) times daily with meals.     MYRBETRIQ 25 MG Tb24 tablet  Generic drug: mirabegron ER  Take 25 mg by mouth daily.     nitroGLYCERIN 0.4 MG SL tablet  Commonly known as: NITROSTAT  Place 0.4 mg under the tongue every 5 (five) minutes as needed for chest pain. Up to 3 doses     omeprazole 20 MG capsule  Commonly known as: PRILOSEC  Take 20 mg  by mouth 2 (two) times daily before a meal.     ondansetron 4 MG tablet  Commonly known as: ZOFRAN  Take 4 mg by mouth every 6 (six) hours as needed for nausea or vomiting.     polyethylene glycol packet  Commonly known as: MIRALAX / GLYCOLAX  Take 17 g by mouth daily as needed (constipation).     potassium chloride 10 MEQ tablet  Commonly known as: K-DUR,KLOR-CON  Take 10 mEq by mouth 2 (two) times daily.     senna 8.6 MG tablet  Commonly known as: SENOKOT  Take 1 tablet by mouth daily as needed for constipation.     spironolactone 25 MG tablet  Commonly known as: ALDACTONE  Take 12.5 mg by mouth daily. Take one-half tablet (12.5mg ) daily     traMADol 50 MG tablet  Commonly known as: ULTRAM  Take 50 mg by mouth every 6 (six) hours as needed. Take 1-2 tablets (50-100mg ) every 4 hours as needed for pain     traZODone 50 MG tablet  Commonly known as: DESYREL  Take 50 mg by mouth at bedtime.           Relevant Imaging Results:  Relevant Lab Results:   Additional Information    Farrel Conners, [place], LCSW

## 2015-07-09 NOTE — H&P (View-Only) (Signed)
[facility] Consult: 10:16 AM 07/05/2015  LOS: 1 day    Referring Provider: Algis Liming MD  Primary Care Physician:  Doctors making house calls at her assisted living. She doesn't know the name of his physician. Primary Gastroenterologist:  Althia Forts. Previous GI doctor was in [place], [place].   Reason for Consultation:  Anemia. Reported dark stools a couple of weeks ago.   HPI: Nicole Randall is a 70 y.o. female.  Around Christmas the patient moved from [place] to an assisted living facility in [place]. Hx CAD, s/p CABG, Afib, started on Eliquis within the last 6 months. CHF (echocardiogram completed today but results not available yet).  Overactive bladder.  Significant spinal disease, status post 6 spinal surgeries on both the cervical and lumbar spine. Chronic right-sided neck pain.  Apparently had multiple hospitalizations before she moved to [place] having to do with failure to thrive, 50 pound weight loss, anorexia, chronic lower extremity edema.     Patient has history of colon polyps of unknown known type. She had polyps on her first colonoscopy in the 1990s. Last colonoscopy performed in [place], [place] and 3 polyps, unknown type, removed. She did not receive a letter indicating that she would require follow-up colonoscopy. EGD at the time of recent colonoscopy showed "severe" reflux symptoms and small hiatal hernia per her report. She had dysphagia at the time, this improved with esophageal dilatation. For 3-4 months patient has had almost daily, a.m. vomiting. This is not bloody.  It has not improved despite taking Prilosec 20 mg twice a day.  Appetite is depressed.  Patient had labs obtained 2/8 after office visit with the cardiologist on 2/7. Hgb was 4.9/MCV 81 and she  was advised to proceed to the emergency department. A little over a month ago hemoglobin was apparently 11.  FOBT is positive. Creatinine is slightly elevated. Albumin low at 2.3. She has been transfused with 3 PRBCs and Hgb now 9.0.  Patient has chronic constipation and manages bowel movements only every 3-4 days. A couple of weeks ago she was started on an known pill laxative, this did not relieve the constipation but she started passing dark, formed stools. This laxative was discontinued, MiraLAX and Senokot restarted and these stools turned brown again.  She did not see blood in her stools. Patient previously used ibuprofen but hasn't been taking it for 2-3 months at the orders of the physician at the assisted living.    Past Medical History  Diagnosis Date  . CHF (congestive heart failure) ([place])   . Afib ([place])   . Bone spur   . Arthritis   . Cancer ([facility])     carcinoma left leg  . Coronary artery disease   . Hypertension     Past Surgical History  Procedure Laterality Date  . Bypass graft    . Back surgery    . Appendectomy    . Coronary angioplasty    . Cholecystectomy    . Tonsillectomy    . Abdominal hysterectomy      partial  .  Eye surgery      bilat cataract, lasix surgery  . Breast lumpectomy Left     1980's    Prior to Admission medications   Medication Sig Start Date End Date Taking? Authorizing Provider  albuterol (PROVENTIL) (2.5 MG/3ML) 0.083% nebulizer solution Take 2.5 mg by nebulization every 6 (six) hours as needed for wheezing or shortness of breath.   Yes Historical Provider, MD  amiodarone (PACERONE) 200 MG tablet Take 200 mg by mouth daily.   Yes Historical Provider, MD  apixaban (ELIQUIS) 5 MG TABS tablet Take 5 mg by mouth 2 (two) times daily.   Yes Historical Provider, MD  aspirin 81 MG tablet Take 81 mg by mouth daily.   Yes Historical Provider, MD  atorvastatin (LIPITOR) 80 MG tablet Take 80 mg by mouth daily.   Yes Historical Provider, MD    calcium carbonate (TUMS EX) 750 MG chewable tablet Chew 1 tablet by mouth 3 (three) times daily as needed for heartburn.   Yes Historical Provider, MD  Cranberry 450 MG CAPS Take 1 capsule by mouth daily.   Yes Historical Provider, MD  cyanocobalamin 1000 MCG tablet Take 100 mcg by mouth daily.   Yes Historical Provider, MD  DULoxetine (CYMBALTA) 60 MG capsule Take 60 mg by mouth daily.   Yes Historical Provider, MD  FLUoxetine (PROZAC) 10 MG tablet Take 10 mg by mouth daily.   Yes Historical Provider, MD  furosemide (LASIX) 20 MG tablet Take 20 mg by mouth daily.   Yes Historical Provider, MD  gabapentin (NEURONTIN) 300 MG capsule Take 300 mg by mouth daily.   Yes Historical Provider, MD  Melatonin 5 MG CAPS Take 1 capsule by mouth at bedtime as needed (sleep).   Yes Historical Provider, MD  midodrine (PROAMATINE) 5 MG tablet Take 5 mg by mouth 3 (three) times daily with meals.   Yes Historical Provider, MD  mirabegron ER (MYRBETRIQ) 25 MG TB24 tablet Take 25 mg by mouth daily.   Yes Historical Provider, MD  nitroGLYCERIN (NITROSTAT) 0.4 MG SL tablet Place 0.4 mg under the tongue every 5 (five) minutes as needed for chest pain. Up to 3 doses   Yes Historical Provider, MD  omeprazole (PRILOSEC) 20 MG capsule Take 20 mg by mouth 2 (two) times daily before a meal.   Yes Historical Provider, MD  ondansetron (ZOFRAN) 4 MG tablet Take 4 mg by mouth every 6 (six) hours as needed for nausea or vomiting.   Yes Historical Provider, MD  polyethylene glycol (MIRALAX / GLYCOLAX) packet Take 17 g by mouth daily as needed (constipation).    Yes Historical Provider, MD  potassium chloride (K-DUR,KLOR-CON) 10 MEQ tablet Take 10 mEq by mouth 2 (two) times daily.   Yes Historical Provider, MD  senna (SENOKOT) 8.6 MG tablet Take 1 tablet by mouth daily as needed for constipation.   Yes Historical Provider, MD  spironolactone (ALDACTONE) 25 MG tablet Take 12.5 mg by mouth daily. Take one-half tablet (12.5mg )  daily    Yes Historical Provider, MD  traMADol (ULTRAM) 50 MG tablet Take 50 mg by mouth every 6 (six) hours as needed. Take 1-2 tablets (50-100mg ) every 4 hours as needed for pain   Yes Historical Provider, MD  traZODone (DESYREL) 50 MG tablet Take 50 mg by mouth at bedtime.   Yes Historical Provider, MD    Scheduled Meds: . pantoprazole (PROTONIX) IV  40 mg Intravenous Q12H  . sodium chloride flush  3 mL Intravenous Q12H   Infusions:  PRN Meds: ipratropium, levalbuterol, morphine injection, ondansetron **OR** ondansetron (ZOFRAN) IV   Allergies as of 07/04/2015  . (No Known Allergies)    Family History  Problem Relation Age of Onset  . Lung cancer Mother   . Heart disease Father   . Throat cancer Brother     Social History   Social History  . Marital Status: Widowed    Spouse Name: N/A  . Number of Children: N/A  . Years of Education: N/A   Occupational History  . Not on file.   Social History Main Topics  . Smoking status: Current Some Day Smoker -- 0.20 packs/day  . Smokeless tobacco: Never Used  . Alcohol Use: No  . Drug Use: No  . Sexual Activity: Not on file   Other Topics Concern  . Not on file   Social History Narrative    REVIEW OF SYSTEMS: Constitutional:  Within about 12 months, through summer of 2015, the patient lost 50 pounds. Since then she's had some increase in her weight but not more than 10 pounds. ENT:  No nose bleeds Pulm:  Periodic cough, not productive. CV:  No palpitations, no LE edema.  GU:  No hematuria, no frequency GI:  Per HPI Heme:  No previous issues with anemia, though she does take oral B12.  Transfusions:  No previous transfusions. Neuro:  No headaches, no peripheral tingling or numbness.  No history stroke or TIA  Derm:  No itching, no rash or sores.  Endocrine:  No sweats or chills.  No polyuria or dysuria Immunization:  Not queried Travel:  None beyond local counties in last few months.    PHYSICAL EXAM: Vital signs in  last 24 hours: Filed Vitals:   07/05/15 0700 07/05/15 0817  BP: 114/55 113/55  Pulse: 64 99  Temp: 98.6 F (37 C) 98 F (36.7 C)  Resp: 20 18   Wt Readings from Last 3 Encounters:  07/04/15 52.889 kg (116 lb 9.6 oz)  07/04/15 51.256 kg (113 lb)    General:  Thin, unwell appearing WF. She is comfortable. Head:  No facial asymmetry or swelling. No signs of head trauma.  Eyes:  No scleral icterus, no conjunctival pallor. Ears:  Slightly diminished hearing.  Nose:  No discharge or congestion Mouth:  Dentures in place.  Moist, clear oral mucosa. Neck:  No TMG, no JVD, no masses. Lungs:  Clear bilaterally. No dyspnea. No cough. Heart:  RRR. No MRG. S1/S2 audible. Abdomen:  Soft. Thin. Not tender or distended. Some fullness in the right abdomen but no discrete masses..   Rectal:  Deferred.   Musc/Skeltl:  Scars consistent with spinal surgery in cervical and lumbar region. Extremities:  Trace pedal/ankle edema.  Neurologic:  Oriented 3. Moves all 4 limbs, strength not tested. No tremor. No gross neurologic deficits. Skin:  Small purpura on the arms. Tattoos:  None Nodes:  No cervical adenopathy.   Psych:  Somewhat depressed but pleasant, cooperative and calm.  Intake/Output from previous day: 02/08 0701 - 02/09 0700 In: 1065 [Blood:1065] Out: 125 [Urine:125] Intake/Output this shift:    LAB RESULTS:  Recent Labs  07/04/15 1127 07/04/15 1609 07/05/15 0750  WBC 6.6 5.7 4.7  HGB 4.9* 4.7* 9.0*  HCT 16.7* 16.4* 28.3*  PLT 134* 127* 72*   BMET Lab Results  Component Value Date   NA 138 07/05/2015   NA 139 07/04/2015   K 4.0 07/05/2015   K 4.1 07/04/2015   CL 104 07/05/2015   CL  105 07/04/2015   CO2 25 07/05/2015   CO2 25 07/04/2015   GLUCOSE 81 07/05/2015   GLUCOSE 84 07/04/2015   BUN 14 07/05/2015   BUN 14 07/04/2015   CREATININE 1.03* 07/05/2015   CREATININE 1.05* 07/04/2015   CALCIUM 8.6* 07/05/2015   CALCIUM 8.7* 07/04/2015   LFT  Recent Labs   07/04/15 1609 07/05/15 0750  PROT 6.3* 6.1*  ALBUMIN 2.3* 2.3*  AST 43* 41  ALT 26 25  ALKPHOS 86 87  BILITOT 0.7 1.1   PT/INR No results found for: INR, PROTIME Hepatitis Panel No results for input(s): HEPBSAG, HCVAB, HEPAIGM, HEPBIGM in the last 72 hours. C-Diff No components found for: CDIFF Lipase  No results found for: LIPASE  Drugs of Abuse  No results found for: LABOPIA, COCAINSCRNUR, LABBENZ, AMPHETMU, THCU, LABBARB   RADIOLOGY STUDIES: No results found.  ENDOSCOPIC STUDIES: none  IMPRESSION:   *  Normocytic anemia. FOBT positive. Dark stools a couple of weeks ago. Good response to transfusion with 3 PRBCs. GI sxs include daily n/v in AM, anorexia, weight loss.  Hx GERD.    *  Thrombocytopenia.  *  Chronic eliquis for hx A fib.  NSR currently. -    PLAN:     *  EGD 1230 tomorrow. Regular diet today.  For now continue the Protonix but will switch it to oral, twice daily.   Azucena Freed  07/05/2015, 10:16 AM Pager: [phone]      Attending physician's note   I have taken a history, examined the patient and reviewed the chart. I agree with the Advanced Practitioner's note, impression and recommendations. 70 year old female with severe anemia and FOBT positive. She gives history of intermittent black stool in the past 3 weeks. We will plan for EGD tomorrow to further evaluate. The risks and benefits as well as alternatives of endoscopic procedure(s) have been discussed and reviewed. All questions answered. The patient agrees to proceed.   Damaris Hippo, MD [phone] Mon-Fri 8a-5p [phone] after 5p, weekends, holidays

## 2015-07-09 NOTE — NC FL2 (Signed)
[place] LEVEL OF CARE SCREENING TOOL     IDENTIFICATION  Patient Name: Nicole Randall Birthdate: 1945/12/14 Sex: female Admission Date (Current Location): 07/04/2015  [facility] and [place] Number:  Herbalist and Address:  [facility], [place] [address] [place], [place] [address]      Provider Number: [id number]  Attending Physician Name and Address:  Modena Jansky, MD  Relative Name and Phone Number:       Current Level of Care: Hospital Recommended Level of Care: [place] Prior Approval Number:    Date Approved/Denied:   PASRR Number:    Discharge Plan:  (return to ALF)    Current Diagnoses: Patient Active Problem List   Diagnosis Date Noted  . Absolute anemia   . Protein-calorie malnutrition, severe 07/05/2015  . UGI bleed 07/04/2015  . Symptomatic anemia 07/04/2015  . Systolic CHF ([place]) [id number]  . HTN (hypertension) 07/04/2015  . CAD (coronary artery disease) 07/04/2015  . PAF (paroxysmal atrial fibrillation) ([place]) 07/04/2015  . Stage I pressure ulcer of sacral region 07/04/2015  . Depression 07/04/2015  . Constipation 07/04/2015  . OSA on CPAP 07/04/2015  . Hyperlipidemia 07/04/2015    Orientation RESPIRATION BLADDER Height & Weight     Self, Time, Situation, Place  Normal Continent Weight: 116 lb 2.9 oz (52.7 kg) Height:  5\' 2"  (157.5 cm)  BEHAVIORAL SYMPTOMS/MOOD NEUROLOGICAL BOWEL NUTRITION STATUS      Continent  (please see dc summary for dietary needs at time of discharge)  AMBULATORY STATUS COMMUNICATION OF NEEDS Skin     Verbally PU Stage and Appropriate Care                       Personal Care Assistance Level of Assistance              Functional Limitations Info             SPECIAL CARE FACTORS FREQUENCY                       Contractures Contractures Info: Not present    Additional Factors Info                  Current Medications  (07/09/2015):  This is the current hospital active medication list Current Facility-Administered Medications  Medication Dose Route Frequency Provider Last Rate Last Dose  . acetaminophen (TYLENOL) tablet 650 mg  650 mg Oral Q6H PRN Modena Jansky, MD      . albuterol (PROVENTIL) (2.5 MG/3ML) 0.083% nebulizer solution 2.5 mg  2.5 mg Nebulization Q4H PRN Modena Jansky, MD      . amiodarone (PACERONE) tablet 200 mg  200 mg Oral Daily Modena Jansky, MD   200 mg at 07/08/15 0949  . atorvastatin (LIPITOR) tablet 80 mg  80 mg Oral Daily Modena Jansky, MD   80 mg at 07/08/15 0949  . feeding supplement (ENSURE ENLIVE) (ENSURE ENLIVE) liquid 237 mL  237 mL Oral BID BM Modena Jansky, MD   237 mL at 07/07/15 1139  . gabapentin (NEURONTIN) capsule 300 mg  300 mg Oral Daily Modena Jansky, MD   300 mg at 07/08/15 0948  . lactated ringers infusion   Intravenous Continuous Nelida Meuse III, MD 10 mL/hr at 07/09/15 0836 1,000 mL at 07/09/15 0836  . midodrine (PROAMATINE) tablet 5 mg  5 mg Oral TID WC Lenis Dickinson  Hongalgi, MD   5 mg at 07/08/15 1716  . mirabegron ER (MYRBETRIQ) tablet 25 mg  25 mg Oral Daily Modena Jansky, MD   25 mg at 07/08/15 1126  . morphine 2 MG/ML injection 2 mg  2 mg Intravenous Q4H PRN Reubin Milan, MD   2 mg at 07/08/15 2142  . nitroGLYCERIN (NITROSTAT) SL tablet 0.4 mg  0.4 mg Sublingual Q5 min PRN Modena Jansky, MD      . ondansetron [facility]) tablet 4 mg  4 mg Oral Q6H PRN Reubin Milan, MD       Or  . ondansetron [facility]) injection 4 mg  4 mg Intravenous Q6H PRN Reubin Milan, MD   4 mg at 07/05/15 0655  . pantoprazole (PROTONIX) EC tablet 40 mg  40 mg Oral BID Vena Rua, PA-C   40 mg at 07/08/15 2142  . potassium chloride SA (K-DUR,KLOR-CON) CR tablet 10 mEq  10 mEq Oral BID Modena Jansky, MD   10 mEq at 07/08/15 2143  . sodium chloride flush (NS) 0.9 % injection 3 mL  3 mL Intravenous Q12H Reubin Milan, MD   3 mL at 07/08/15 2143  .  traZODone (DESYREL) tablet 50 mg  50 mg Oral QHS PRN Ritta Slot, NP   50 mg at 07/08/15 2142  . vitamin B-12 (CYANOCOBALAMIN) tablet 100 mcg  100 mcg Oral Daily Modena Jansky, MD   100 mcg at 07/08/15 [id number]     Discharge Medications: Please see discharge summary for a list of discharge medications.  Relevant Imaging Results:  Relevant Lab Results:   Additional Information    Farrel Conners, [place], LCSW

## 2015-07-09 NOTE — Op Note (Signed)
[facility] [place], [id number]   COLONOSCOPY PROCEDURE REPORT  PATIENT: Nicole Randall, Nicole Randall  MR#: [id number] BIRTHDATE: 1946/03/16 , 26  yrs. old GENDER: female ENDOSCOPIST: Wilfrid Lund, MD REFERRED BY: PROCEDURE DATE:  07/09/2015 PROCEDURE:   Colonoscopy, diagnostic  ASA CLASS:   Class III INDICATIONS:Chronic blood loss anemia with heme positive stool. MEDICATIONS: Monitored anesthesia care  DESCRIPTION OF PROCEDURE:   After the risks benefits and alternatives of the procedure were thoroughly explained, informed consent was obtained.  The digital rectal exam revealed decreased sphincter tone.   The Pentax Adult Colon [id number]  endoscope was introduced through the anus and advanced to the ileum. No adverse events experienced.   The quality of the prep was good.  (MoviPrep was used)  The instrument was then slowly withdrawn as the colon was fully examined. Estimated blood loss is zero unless otherwise noted in this procedure report.      COLON FINDINGS: the terminal ileum was normal There was moderate diverticulosis noted in the left colon. Retroflexed views revealed no abnormalities. The time to cecum = 5.7 Withdrawal time = 8.0   The scope was withdrawn and the procedure completed. COMPLICATIONS: There were no immediate complications.  ENDOSCOPIC IMPRESSION: There was moderate diverticulosis noted in the left colon no source of anemia was found  RECOMMENDATIONS: #1 the patient's anticoagulation can be resumed today #2 please discuss with her cardiologist how strongly she needs aspirin #3 the patient can be discharged home from a GI perspective to plan an outpatient small bowel video capsule study. #4 ) follow up with PCP to check hemoglobin #5 oral iron supplements ( inform patient that I will turn her stool dark)  eSigned:  Wilfrid Lund, MD 07/09/2015 9:35 AM      PATIENT NAME:  Nicole Randall, Nicole Randall MR#: [id number]

## 2015-07-09 NOTE — Anesthesia Postprocedure Evaluation (Signed)
Anesthesia Post Note  Patient: Nicole Randall  Procedure(s) Performed: Procedure(s) (LRB): COLONOSCOPY WITH PROPOFOL (N/A)  Patient location during evaluation: PACU Anesthesia Type: MAC Level of consciousness: awake and alert Pain management: pain level controlled Vital Signs Assessment: post-procedure vital signs reviewed and stable Respiratory status: spontaneous breathing, nonlabored ventilation, respiratory function stable and patient connected to nasal cannula oxygen Cardiovascular status: stable and blood pressure returned to baseline Anesthetic complications: no    Last Vitals:  Filed Vitals:   07/09/15 0829 07/09/15 0931  BP: 145/62 104/40  Pulse: 77 62  Temp: 37.1 C 36.2 C  Resp: 16 20    Last Pain:  Filed Vitals:   07/09/15 0933  PainSc: 7                  Zenaida Deed

## 2015-07-09 NOTE — Anesthesia Procedure Notes (Signed)
Procedure Name: MAC Date/Time: 07/09/2015 9:05 AM Performed by: Suzy Bouchard Pre-anesthesia Checklist: Patient identified, Timeout performed, Suction available, Patient being monitored and Emergency Drugs available Patient Re-evaluated:Patient Re-evaluated prior to inductionOxygen Delivery Method: Simple face mask Intubation Type: IV induction

## 2015-07-09 NOTE — Transfer of Care (Signed)
Immediate Anesthesia Transfer of Care Note  Patient: Nicole Randall  Procedure(s) Performed: Procedure(s): COLONOSCOPY WITH PROPOFOL (N/A)  Patient Location: Endoscopy Unit  Anesthesia Type:MAC  Level of Consciousness: awake and alert   Airway & Oxygen Therapy: Patient Spontanous Breathing and Patient connected to face mask oxygen  Post-op Assessment: Report given to RN, Post -op Vital signs reviewed and stable and Patient moving all extremities  Post vital signs: Reviewed and stable  Last Vitals:  Filed Vitals:   07/09/15 0829 07/09/15 0931  BP: 145/62 104/40  Pulse: 77 62  Temp: 37.1 C 36.2 C  Resp: 16 20    Complications: No apparent anesthesia complications

## 2015-07-09 NOTE — Discharge Instructions (Signed)
Iron Deficiency Anemia, Adult Anemia is a condition in which there are less red blood cells or hemoglobin in the blood than normal. Hemoglobin is the part of red blood cells that carries oxygen. Iron deficiency anemia is anemia caused by too little iron. It is the most common type of anemia. It may leave you tired and short of breath. CAUSES   Lack of iron in the diet.  Poor absorption of iron, as seen with intestinal disorders.  Intestinal bleeding.  Heavy periods. SIGNS AND SYMPTOMS  Mild anemia may not be noticeable. Symptoms may include:  Fatigue.  Headache.  Pale skin.  Weakness.  Tiredness.  Shortness of breath.  Dizziness.  Cold hands and feet.  Fast or irregular heartbeat. DIAGNOSIS  Diagnosis requires a thorough evaluation and physical exam by your health care provider. Blood tests are generally used to confirm iron deficiency anemia. Additional tests may be done to find the underlying cause of your anemia. These may include:  Testing for blood in the stool (fecal occult blood test).  A procedure to see inside the colon and rectum (colonoscopy).  A procedure to see inside the esophagus and stomach (endoscopy). TREATMENT  Iron deficiency anemia is treated by correcting the cause of the deficiency. Treatment may involve:  Adding iron-rich foods to your diet.  Taking iron supplements. Pregnant or breastfeeding women need to take extra iron because their normal diet usually does not provide the required amount.  Taking vitamins. Vitamin C improves the absorption of iron. Your health care provider may recommend that you take your iron tablets with a glass of orange juice or vitamin C supplement.  Medicines to make heavy menstrual flow lighter.  Surgery. HOME CARE INSTRUCTIONS   Take iron as directed by your health care provider.  If you cannot tolerate taking iron supplements by mouth, talk to your health care provider about taking them through a vein  (intravenously) or an injection into a muscle.  For the best iron absorption, iron supplements should be taken on an empty stomach. If you cannot tolerate them on an empty stomach, you may need to take them with food.  Do not drink milk or take antacids at the same time as your iron supplements. Milk and antacids may interfere with the absorption of iron.  Iron supplements can cause constipation. Make sure to include fiber in your diet to prevent constipation. A stool softener may also be recommended.  Take vitamins as directed by your health care provider.  Eat a diet rich in iron. Foods high in iron include liver, lean beef, whole-grain bread, eggs, dried fruit, and dark green leafy vegetables. SEEK IMMEDIATE MEDICAL CARE IF:   You faint. If this happens, do not drive. Call your local emergency services (911 in [place] if no other help is available.  You have chest pain.  You feel nauseous or vomit.  You have severe or increased shortness of breath with activity.  You feel weak.  You have a rapid heartbeat.  You have unexplained sweating.  You become light-headed when getting up from a chair or bed. MAKE SURE YOU:   Understand these instructions.  Will watch your condition.  Will get help right away if you are not doing well or get worse.   This information is not intended to replace advice given to you by your health care provider. Make sure you discuss any questions you have with your health care provider.   Document Released: 05/09/2000 Document Revised: 06/02/2014 Document Reviewed: 01/17/2013 Elsevier  Interactive Patient Education 2016 [place].  Gastrointestinal Bleeding Gastrointestinal (GI) bleeding means there is bleeding somewhere along the digestive tract, between the mouth and anus. CAUSES  There are many different problems that can cause GI bleeding. Possible causes include:  Esophagitis. This is inflammation, irritation, or swelling of the  esophagus.  Hemorrhoids.These are veins that are full of blood (engorged) in the rectum. They cause pain, inflammation, and may bleed.  Anal fissures.These are areas of painful tearing which may bleed. They are often caused by passing hard stool.  Diverticulosis.These are pouches that form on the colon over time, with age, and may bleed significantly.  Diverticulitis.This is inflammation in areas with diverticulosis. It can cause pain, fever, and bloody stools, although bleeding is rare.  Polyps and cancer. Colon cancer often starts out as precancerous polyps.  Gastritis and ulcers.Bleeding from the upper gastrointestinal tract (near the stomach) may travel through the intestines and produce black, sometimes tarry, often bad smelling stools. In certain cases, if the bleeding is fast enough, the stools may not be black, but red. This condition may be life-threatening. SYMPTOMS   Vomiting bright red blood or material that looks like coffee grounds.  Bloody, black, or tarry stools. DIAGNOSIS  Your caregiver may diagnose your condition by taking your history and performing a physical exam. More tests may be needed, including:  X-rays and other imaging tests.  Esophagogastroduodenoscopy (EGD). This test uses a flexible, lighted tube to look at your esophagus, stomach, and small intestine.  Colonoscopy. This test uses a flexible, lighted tube to look at your colon. TREATMENT  Treatment depends on the cause of your bleeding.   For bleeding from the esophagus, stomach, small intestine, or colon, the caregiver doing your EGD or colonoscopy may be able to stop the bleeding as part of the procedure.  Inflammation or infection of the colon can be treated with medicines.  Many rectal problems can be treated with creams, suppositories, or warm baths.  Surgery is sometimes needed.  Blood transfusions are sometimes needed if you have lost a lot of blood. If bleeding is slow, you may be  allowed to go home. If there is a lot of bleeding, you will need to stay in the hospital for observation. HOME CARE INSTRUCTIONS   Take any medicines exactly as prescribed.  Keep your stools soft by eating foods that are high in fiber. These foods include whole grains, legumes, fruits, and vegetables. Prunes (1 to 3 a day) work well for many people.  Drink enough fluids to keep your urine clear or pale yellow. SEEK IMMEDIATE MEDICAL CARE IF:   Your bleeding increases.  You feel lightheaded, weak, or you faint.  You have severe cramps in your back or abdomen.  You pass large blood clots in your stool.  Your problems are getting worse. MAKE SURE YOU:   Understand these instructions.  Will watch your condition.  Will get help right away if you are not doing well or get worse.   This information is not intended to replace advice given to you by your health care provider. Make sure you discuss any questions you have with your health care provider.   Document Released: 05/09/2000 Document Revised: 04/28/2012 Document Reviewed: 10/30/2014 Elsevier Interactive Patient Education [employer].

## 2015-07-09 NOTE — Anesthesia Preprocedure Evaluation (Addendum)
Anesthesia Evaluation  Patient identified by MRN, date of birth, ID band Patient awake    Reviewed: Allergy & Precautions, NPO status , Patient's Chart, lab work & pertinent test results  Airway Mallampati: II  TM Distance: >3 FB Neck ROM: Full    Dental  (+) Edentulous Upper, Edentulous Lower   Pulmonary sleep apnea and Continuous Positive Airway Pressure Ventilation , Current Smoker,    breath sounds clear to auscultation       Cardiovascular hypertension, + dysrhythmias Atrial Fibrillation  Rhythm:Irregular Rate:Normal  Echo 06/2015 with EF 30%, moderate MR, elevated systolic pulmonary artery pressures   Neuro/Psych PSYCHIATRIC DISORDERS Depression    GI/Hepatic   Endo/Other    Renal/GU      Musculoskeletal  (+) Arthritis ,   Abdominal   Peds  Hematology  (+) anemia ,   Anesthesia Other Findings   Reproductive/Obstetrics                          Anesthesia Physical  Anesthesia Plan  ASA: III  Anesthesia Plan: MAC   Post-op Pain Management:    Induction: Intravenous  Airway Management Planned: Simple Face Mask  Additional Equipment:   Intra-op Plan:   Post-operative Plan:   Informed Consent: I have reviewed the patients History and Physical, chart, labs and discussed the procedure including the risks, benefits and alternatives for the proposed anesthesia with the patient or authorized representative who has indicated his/her understanding and acceptance.     Plan Discussed with: CRNA and Anesthesiologist  Anesthesia Plan Comments:         Anesthesia Quick Evaluation

## 2015-07-10 ENCOUNTER — Encounter (HOSPITAL_COMMUNITY): Payer: Self-pay | Admitting: Gastroenterology

## 2015-07-17 ENCOUNTER — Telehealth: Payer: Self-pay | Admitting: Cardiology

## 2015-07-17 NOTE — Telephone Encounter (Signed)
Lillie Columbia [facility] RN, called to report possible allergic reaction to Eliquis.  Patient went to the hospital 2/13 and she st the only difference now is that she is on Eliquis.  She reports what looks like "road rash" on the patient's arms, legs, and hands that get scabby, bust open, and bleed.  Confirmed with RN that patient is NOT HAVING SOB. She has no other complaints other than skin reactions.  To [facility] for recommendations.

## 2015-07-17 NOTE — Telephone Encounter (Signed)
Please call asap,she is at the patient's home. She might be having an allergic reaction to her medicine.

## 2015-07-18 ENCOUNTER — Other Ambulatory Visit: Payer: Self-pay | Admitting: Cardiology

## 2015-07-18 ENCOUNTER — Other Ambulatory Visit: Payer: Self-pay

## 2015-07-18 ENCOUNTER — Ambulatory Visit (HOSPITAL_COMMUNITY): Payer: MEDICARE | Attending: Cardiology

## 2015-07-18 DIAGNOSIS — I358 Other nonrheumatic aortic valve disorders: Secondary | ICD-10-CM | POA: Insufficient documentation

## 2015-07-18 DIAGNOSIS — R0602 Shortness of breath: Secondary | ICD-10-CM

## 2015-07-18 DIAGNOSIS — I340 Nonrheumatic mitral (valve) insufficiency: Secondary | ICD-10-CM | POA: Insufficient documentation

## 2015-07-18 DIAGNOSIS — R0600 Dyspnea, unspecified: Secondary | ICD-10-CM | POA: Diagnosis present

## 2015-07-18 DIAGNOSIS — R29898 Other symptoms and signs involving the musculoskeletal system: Secondary | ICD-10-CM | POA: Insufficient documentation

## 2015-07-18 MED ORDER — PERFLUTREN LIPID MICROSPHERE
1.0000 mL | INTRAVENOUS | Status: AC | PRN
  Administered 2015-07-18: 1 mL via INTRAVENOUS

## 2015-07-18 NOTE — Telephone Encounter (Signed)
Spoke with Juliann Pulse from [facility].  Juliann Pulse states this spots occurred prior to hospitalization, resolved while she was off Eliquis in the hospital and have now reappeared.  Pt states she is not feeling well, having more sweeling and itching on her legs since restarting Eliquis.    Roanna Epley I would see if we have the records from [facility] to review and will discuss options with Dr. Curt Bears.

## 2015-07-20 NOTE — Telephone Encounter (Signed)
Discussed options with Dr. Curt Bears.  He suggested switching patient to Xarelto.  Her most recent SCr was 0.8 with a CrCl of 49 mL/min.  Given recent bleed, will dose at 15mg  once daily.  Spoke with Juliann Pulse with [facility] with updates.  Also faxed order to [facility]. [phone]

## 2015-07-23 ENCOUNTER — Ambulatory Visit (INDEPENDENT_AMBULATORY_CARE_PROVIDER_SITE_OTHER): Payer: MEDICARE | Admitting: Physician Assistant

## 2015-07-23 ENCOUNTER — Encounter: Payer: Self-pay | Admitting: Physician Assistant

## 2015-07-23 VITALS — BP 124/62 | HR 69 | Ht 62.0 in | Wt 109.0 lb

## 2015-07-23 DIAGNOSIS — I4891 Unspecified atrial fibrillation: Secondary | ICD-10-CM

## 2015-07-23 DIAGNOSIS — I5023 Acute on chronic systolic (congestive) heart failure: Secondary | ICD-10-CM

## 2015-07-23 DIAGNOSIS — I2510 Atherosclerotic heart disease of native coronary artery without angina pectoris: Secondary | ICD-10-CM

## 2015-07-23 DIAGNOSIS — I480 Paroxysmal atrial fibrillation: Secondary | ICD-10-CM | POA: Diagnosis not present

## 2015-07-23 DIAGNOSIS — K922 Gastrointestinal hemorrhage, unspecified: Secondary | ICD-10-CM

## 2015-07-23 MED ORDER — NITROGLYCERIN 0.4 MG SL SUBL: 0.4000 mg | SUBLINGUAL_TABLET | SUBLINGUAL | Status: AC | PRN

## 2015-07-23 NOTE — Assessment & Plan Note (Signed)
Stable without chest pain. Trying to obtain records from [facility]

## 2015-07-23 NOTE — Assessment & Plan Note (Signed)
Patient is having blood work drawn at the assisted living today. She also has follow-up with GI scheduled.

## 2015-07-23 NOTE — Addendum Note (Signed)
Addended by: Claude Manges on: 07/23/2015 01:02 PM   Modules accepted: Orders

## 2015-07-23 NOTE — Progress Notes (Signed)
Cardiology Office Note   Date:  07/23/2015   ID:  Nicole Randall, DOB 1945/07/15, MRN [id number]  PCP:  No PCP Per Patient  Cardiologist:  Dr. Curt Bears  Chief Complaint: weakness and leg edema    History of Present Illness: Nicole Randall is a 70 y.o. female who presents for   Two-week post hospital visit. Patient was hospitalized for acute GI bleed with hemoglobin of 4.7 after labs were drawn by Dr. Curt Bears for her first EP evaluation. She has a history of CAD s/p CABG x 2 (09/1985 & 10/1985 and multiple cath), blood clot near aorta bification s/p angioplasty 1994, Carotid artery disease s/p L CEA 07/2014 and has disease in R carotid artery, PAF s/p DCCV x 4 (converted to sinus rhythm 4th times with [facility]) on eliquis for anticoagulation, OSA on CPAP, HTN, spinal disease s/p 4 cervical and 3 lumber spine surgery and on going tobacco abuse  Previously treated in [place]  And still trying to obtain records.   Endoscopy and colonoscopy did not show any source of bleed and GI cleared the patient to start taking anticoagulation again and she was placed on Eliquis  But developed significant rash. Dr. Curt Bears recommended switching the patient to Xarelto 15 mg daily because of recent GI bleed.   Patient comes in today from assisted living saying that when her legs lock up and she can't walk she becomes anxious and short of breath. When pain goes away after a few minutes she feels better. Feels like she has no circulation in her legs. Also having back pains. Denies any chest pain or palpitations. She is having blood drawn today at assisted living. She is going to be established with primary care in April. She's had two 2-D echo done in the past 2 weeks. First one showed EF of 30-35% second one showed improvement of her EF at 35-40%. She does have some leg edema and about a 3 pound weight gain overnight. She says she's been on Coreg in the past but her blood pressure got too low and she didn't tolerate  it.    Past Medical History  Diagnosis Date  . CHF (congestive heart failure) ([place])   . Afib ([place])   . Bone spur   . Arthritis   . Cancer (H[facility])     carcinoma left leg  . Coronary artery disease   . Hypertension   . CAD (coronary artery disease)   . OSA on CPAP   . Impaired memory 2015  . Depression with anxiety 2015  . Gait disturbance 2015  . Osteoporosis 2015    Past Surgical History  Procedure Laterality Date  . Bypass graft    . Back surgery    . Appendectomy    . Coronary angioplasty    . Cholecystectomy    . Tonsillectomy    . Abdominal hysterectomy      partial  . Eye surgery      bilat cataract, lasix surgery  . Breast lumpectomy Left     1980's  . Esophagogastroduodenoscopy (egd) with propofol N/A 07/06/2015    Procedure: ESOPHAGOGASTRODUODENOSCOPY (EGD) WITH PROPOFOL;  Surgeon: Mauri Pole, MD;  Location: [facility] ENDOSCOPY;  Service: Endoscopy;  Laterality: N/A;  . Colonoscopy with propofol N/A 07/09/2015    Procedure: COLONOSCOPY WITH PROPOFOL;  Surgeon: Doran Stabler, MD;  [facility] ENDOSCOPY;  Service: Endoscopy;  Laterality: N/A;     Current Outpatient Prescriptions  Medication Sig Dispense Refill  . acetaminophen (TYLENOL) 500 MG tablet  Take 500 mg by mouth every 6 (six) hours as needed (body pain).    Marland Kitchen albuterol (PROVENTIL) (2.5 MG/3ML) 0.083% nebulizer solution Take 2.5 mg by nebulization every 6 (six) hours as needed for wheezing or shortness of breath.    Marland Kitchen amiodarone (PACERONE) 200 MG tablet Take 200 mg by mouth daily.    Marland Kitchen aspirin 81 MG tablet Take 81 mg by mouth daily.    Marland Kitchen atorvastatin (LIPITOR) 80 MG tablet Take 80 mg by mouth daily.    . calcium carbonate (TUMS EX) 750 MG chewable tablet Chew 1 tablet by mouth 3 (three) times daily as needed for heartburn.    . Cranberry 450 MG CAPS Take 1 capsule by mouth daily.    . cyanocobalamin 1000 MCG tablet Take 100 mcg by mouth daily.    . DULoxetine (CYMBALTA) 60 MG capsule Take 60 mg by  mouth daily.    . feeding supplement, ENSURE ENLIVE, (ENSURE ENLIVE) LIQD Take 237 mLs by mouth 2 (two) times daily between meals.    . ferrous sulfate 325 (65 FE) MG tablet Take 1 tablet (325 mg total) by mouth 2 (two) times daily with a meal. 60 tablet 0  . FLUoxetine (PROZAC) 10 MG tablet Take 10 mg by mouth daily.    . furosemide (LASIX) 20 MG tablet Take 20 mg by mouth daily.    Marland Kitchen gabapentin (NEURONTIN) 300 MG capsule Take 300 mg by mouth daily.    . Melatonin 5 MG CAPS Take 1 capsule by mouth at bedtime as needed (sleep).    . midodrine (PROAMATINE) 5 MG tablet Take 5 mg by mouth 3 (three) times daily with meals.    . mirabegron ER (MYRBETRIQ) 25 MG TB24 tablet Take 25 mg by mouth daily.    . nitroGLYCERIN (NITROSTAT) 0.4 MG SL tablet Place 0.4 mg under the tongue every 5 (five) minutes as needed for chest pain. Up to 3 doses    . omeprazole (PRILOSEC) 20 MG capsule Take 20 mg by mouth 2 (two) times daily before a meal.    . ondansetron (ZOFRAN) 4 MG tablet Take 4 mg by mouth every 6 (six) hours as needed for nausea or vomiting.    . polyethylene glycol (MIRALAX / GLYCOLAX) packet Take 17 g by mouth daily as needed (constipation).     . potassium chloride (K-DUR,KLOR-CON) 10 MEQ tablet Take 10 mEq by mouth 2 (two) times daily.    . Rivaroxaban (XARELTO) 15 MG TABS tablet Take 15 mg by mouth 2 (two) times daily with a meal.    . senna (SENOKOT) 8.6 MG tablet Take 1 tablet by mouth daily as needed for constipation.    Marland Kitchen spironolactone (ALDACTONE) 25 MG tablet Take 12.5 mg by mouth daily. Take one-half tablet (12.5mg )  daily    . traMADol (ULTRAM) 50 MG tablet Take 50 mg by mouth every 6 (six) hours as needed. Take 1-2 tablets (50-100mg ) every 4 hours as needed for pain    . traZODone (DESYREL) 50 MG tablet Take 50 mg by mouth at bedtime.     No current facility-administered medications for this visit.    Allergies:   Review of patient's allergies indicates no known allergies.    Social  History:  The patient  reports that she has been smoking.  She has never used smokeless tobacco. She reports that she does not drink alcohol or use illicit drugs.   Family History:  The patient's    family history includes Heart disease  in her father; Lung cancer in her mother; Throat cancer in her brother.    ROS:  Please see the history of present illness.   Otherwise, review of systems are positive for  Weight loss decrease appetite, chronic leg pain, hearing loss, constipation, anxiety depression , chronic back pain, easy bruising.   All other systems are reviewed and negative.    PHYSICAL EXAM: VS:  BP 124/62 mmHg  Pulse 69  Ht 5\' 2"  (1.575 m)  Wt 109 lb (49.442 kg)  BMI 19.93 kg/m2 , BMI Body mass index is 19.93 kg/(m^2). GEN: Well nourished, well developed, in no acute distress Neck: no JVD, HJR, carotid bruits, or masses Cardiac:  RRR;  Positive S4, 2/6 systolic murmur at the left sternal border, no rubs, thrill or heave,  Respiratory:  clear to auscultation bilaterally, normal work of breathing GI: soft, nontender, nondistended, + BS MS: no deformity or atrophy Extremities:  +1 edema without cyanosis, clubbing,  good  decreased pulses bilaterally.  Skin: warm and dry, no rash Neuro:  Strength and sensation are intact    EKG:  EKG is ordered today. The ekg ordered today demonstrates normal sinus rhythm with poor R wave progression anteriorly   Recent Labs: 07/04/2015: Magnesium 1.7 07/05/2015: ALT 25 07/08/2015: BUN 12; Creatinine, Ser 0.88; Potassium 4.2; Sodium 140 07/09/2015: Hemoglobin 9.9*; Platelets 107*    Lipid Panel No results found for: CHOL, TRIG, HDL, CHOLHDL, VLDL, LDLCALC, LDLDIRECT    Wt Readings from Last 3 Encounters:  07/23/15 109 lb (49.442 kg)  07/05/15 116 lb 2.9 oz (52.7 kg)  07/04/15 113 lb (51.256 kg)      Other studies Reviewed: Additional studies/ records that were reviewed today include and review of the records demonstrates:   2-D echo  07/18/15 Study Conclusions  - Left ventricle: The cavity size was normal. Wall thickness was   normal. Systolic function was moderately reduced. The estimated   ejection fraction was in the range of 35% to 40%. There is   akinesis of the apicalanteroseptal, inferoseptal, and apical   myocardium. - Aortic valve: Trileaflet; mildly thickened, moderately calcified   leaflets. - Mitral valve: Calcified annulus. Mildly thickened leaflets .   There was moderate regurgitation. - Left atrium: The atrium was severely dilated. - Pulmonary arteries: Systolic pressure was mildly increased. PA   peak pressure: 36 mm Hg (S).  Impressions:  - EF has increased since prior study (35%)  2-D echo 07/05/15 Study Conclusions  - Left ventricle: The cavity size was mildly dilated. Wall   thickness was normal. Systolic function was moderately to   severely reduced. The estimated ejection fraction was in the   range of 30% to 35%. There is akinesis of the   basal-midanteroseptal and apical myocardium. The study is not   technically sufficient to allow evaluation of LV diastolic   function. - Aortic valve: Trileaflet; mildly thickened, mildly calcified   leaflets. There was trivial regurgitation. - Mitral valve: Calcified annulus. Mildly thickened, moderately   calcified leaflets . There was moderate regurgitation. - Left atrium: The atrium was severely dilated. Volume/bsa, ES   (1-plane Simpson&'s, A4C): 71.1 ml/m^2. - Right ventricle: The cavity size was mildly dilated. Wall   thickness was normal. - Right atrium: The atrium was severely dilated. - Tricuspid valve: There was moderate regurgitation. - Pulmonary arteries: Systolic pressure was moderately to severely   increased. PA peak pressure: 63 mm Hg (S).    ASSESSMENT AND PLAN:  PAF (paroxysmal atrial fibrillation) ([place])  Patient is in normal sinus rhythm today on amiodarone. She did have a GI bleed with hemoglobin down to 4.7. No source has  been found. She is back on Xarelto 15 mg once daily after an allergic reaction to Eliquis. Dr. Curt Bears would  Like her referred to Dr. Burt Knack for possible watchman and to establish general cardiology care. Have discussed with Chanetta Marshall.  Systolic CHF [facility])  Patient had 2-D echoes twice in the past 2 weeks. Most recent on 07/18/15 EF 35-40%. She has moderate MR. Increase systolic pressure of the pulmonary arteries but with her lower than 2 weeks ago. We'll give  Next her dose of Lasix and potassium today and can take an extra her for weight gain of 2-3 pounds overnight.  CAD (coronary artery disease)  Stable without chest pain. Trying to obtain records from [facility]  UGI bleed  Patient is having blood work drawn at the assisted living today. She also has follow-up with GI scheduled.    Sumner Boast, PA-C  07/23/2015 11:58 AM    [facility] [address], [place], [place]  [address] Phone: [phone]; Fax: [phone]

## 2015-07-23 NOTE — Assessment & Plan Note (Signed)
Patient is in normal sinus rhythm today on amiodarone. She did have a GI bleed with hemoglobin down to 4.7. No source has been found. She is back on Xarelto 15 mg once daily after an allergic reaction to Eliquis. Dr. Curt Bears would  Like her referred to Dr. Burt Knack for possible watchman and to establish general cardiology care. Have discussed with Chanetta Marshall.

## 2015-07-23 NOTE — Patient Instructions (Addendum)
Medication Instructions:   YOU MAY TAKE A EXTRA LASIX AND POTASSIUM TODAY FOR FLUID BUILD UP     If you need a refill on your cardiac medications before your next appointment, please call your pharmacy.  Labwork: NONE ORDER TODAY   Testing/Procedures:  NONE ORDER TODAY    Follow-Up:  Dr Burt Knack  Next available appt ..for [place]  In 3 to 4 months  FOR HEART FAILURE MONITORING                       Dr Curt Bears  In 3 to 4 months for Afib managment  Any Other Special Instructions Will Be Listed Below (If Applicable).

## 2015-07-23 NOTE — Assessment & Plan Note (Signed)
Patient had 2-D echoes twice in the past 2 weeks. Most recent on 07/18/15 EF 35-40%. She has moderate MR. Increase systolic pressure of the pulmonary arteries but with her lower than 2 weeks ago. We'll give  Next her dose of Lasix and potassium today and can take an extra her for weight gain of 2-3 pounds overnight.

## 2015-07-27 ENCOUNTER — Ambulatory Visit (INDEPENDENT_AMBULATORY_CARE_PROVIDER_SITE_OTHER): Payer: MEDICARE | Admitting: Cardiovascular Disease

## 2015-07-27 ENCOUNTER — Encounter: Payer: Self-pay | Admitting: Cardiovascular Disease

## 2015-07-27 VITALS — BP 102/50 | HR 56 | Ht 62.0 in | Wt 107.8 lb

## 2015-07-27 DIAGNOSIS — I480 Paroxysmal atrial fibrillation: Secondary | ICD-10-CM | POA: Diagnosis not present

## 2015-07-27 DIAGNOSIS — I739 Peripheral vascular disease, unspecified: Secondary | ICD-10-CM | POA: Diagnosis not present

## 2015-07-27 DIAGNOSIS — D649 Anemia, unspecified: Secondary | ICD-10-CM | POA: Diagnosis not present

## 2015-07-27 DIAGNOSIS — I6529 Occlusion and stenosis of unspecified carotid artery: Secondary | ICD-10-CM | POA: Diagnosis not present

## 2015-07-27 LAB — BASIC METABOLIC PANEL
BUN: 12 mg/dL (ref 7–25)
CHLORIDE: 105 mmol/L (ref 98–110)
CO2: 31 mmol/L (ref 20–31)
Calcium: 8.4 mg/dL — ABNORMAL LOW (ref 8.6–10.4)
Creat: 0.81 mg/dL (ref 0.60–0.93)
Glucose, Bld: 72 mg/dL (ref 65–99)
POTASSIUM: 3.7 mmol/L (ref 3.5–5.3)
Sodium: 141 mmol/L (ref 135–146)

## 2015-07-27 NOTE — Progress Notes (Signed)
Watchman Consult Note Date:  08/01/2015   ID:  Nicole Randall, DOB [birth year], MRN [id number]  PCP:  [facility] - seen at [facility]F  Primary Electrophysiologist: [place] Referring Physician: Curt Bears  CC: to discuss Watchman implant    History of Present Illness: Nicole Randall is a 70 y.o. female who presents today for evaluation of left atrial appendage occluder.  Past medical history is notable for CAD s/p CABG x 2 (09/1985 & 10/1985 and multiple cath), blood clot near aorta bification s/p angioplasty 1994, Carotid artery disease s/p L CEA 07/2014 and has disease in R carotid artery, persistent atrial fibrillation (currently on amiodarone), OSA on CPAP, HTN, spinal disease s/p 4 cervical and 3 lumber spine surgery and on going tobacco abuse.  She recently moved to [place] from [place] to be closer to her daughter. She was seen by Dr Curt Bears to establish for cardiology care. Lab work at that time demonstrated severe anemia and she was admitted for further evaluation.  GI evaluation did not find obvious source of bleeding and she was restarted on [place].  She developed a rash and her Eliquis was changed to Xarelto at recent visit with Estella Husk, PA.  The patient has been evaluated by their referring physician and is felt to be a poor candidate for long term [place] due to GI bleeding.  She therefore presents today for Watchman evaluation.    She has had multiple hospitalizations in the past year for failure to thrive, decreased appetite and unintentional weight loss of about 50 pounds. She is receiving home health wound care due to chronic edema, with occasional oozing blisters and erythema.  She feels that her memory problems are getting worse. She also states that she is depressed.  Her activity is limited by orthostatic intolerance and leg pain.  She has had recurrent melena yesterday.   Echocardiogram 07/18/15 demonstrated EF 35-40%, akinesis of apicalanteroseptal, inferoseptal, and  apical myocardium, moderate MR, LA 55.  Today, she denies symptoms of palpitations, chest pain, shortness of breath, orthopnea, PND, presyncope, syncope, bleeding, or neurologic sequela. The patient is tolerating medications without difficulties and is otherwise without complaint today.    Past Medical History  Diagnosis Date  . CHF (congestive heart failure) ([place])   . Persistent atrial fibrillation ([place])   . Bone spur   . Arthritis   . Cancer (HCC)     carcinoma left leg  . Coronary artery disease     a. s/p CABG x2  . Hypertension   . OSA on CPAP   . Impaired memory   . Depression with anxiety   . Gait disturbance   . Osteoporosis   . GI bleed   . Carotid artery disease ([place])     a. s/p L CEA  . Mitral valvular regurgitation     a. moderate to severe  . PAD (peripheral artery disease) (HCC)     a. s/p PTA of left internal iliac   Past Surgical History  Procedure Laterality Date  . Bypass graft    . Back surgery    . Appendectomy    . Coronary angioplasty    . Cholecystectomy    . Tonsillectomy    . Abdominal hysterectomy      partial  . Eye surgery      bilat cataract, lasix surgery  . Breast lumpectomy Left     1980's  . Esophagogastroduodenoscopy (egd) with propofol N/A 07/06/2015    Procedure: ESOPHAGOGASTRODUODENOSCOPY (EGD) WITH PROPOFOL;  Surgeon: Harl Bowie  V, MD;  Location: [facility] ENDOSCOPY;  Service: Endoscopy;  Laterality: N/A;  . Colonoscopy with propofol N/A 07/09/2015    Procedure: COLONOSCOPY WITH PROPOFOL;  Surgeon: Doran Stabler, MD;  Location: [facility] ENDOSCOPY;  Service: Endoscopy;  Laterality: N/A;     Current Outpatient Prescriptions  Medication Sig Dispense Refill  . acetaminophen (TYLENOL) 500 MG tablet Take 500 mg by mouth every 6 (six) hours as needed (body pain).    Marland Kitchen albuterol (PROVENTIL) (2.5 MG/3ML) 0.083% nebulizer solution Take 2.5 mg by nebulization every 6 (six) hours as needed for wheezing or shortness of breath.    Marland Kitchen amiodarone  (PACERONE) 200 MG tablet Take 200 mg by mouth daily.    Marland Kitchen atorvastatin (LIPITOR) 80 MG tablet Take 80 mg by mouth daily.    . calcium carbonate (TUMS EX) 750 MG chewable tablet Chew 1 tablet by mouth 3 (three) times daily as needed for heartburn.    . citalopram (CELEXA) 10 MG tablet Take 10 mg by mouth daily.    . Cranberry 450 MG CAPS Take 1 capsule by mouth daily.    . cyanocobalamin 1000 MCG tablet Take 100 mcg by mouth daily.    . DULoxetine (CYMBALTA) 60 MG capsule Take 60 mg by mouth daily.    . feeding supplement, ENSURE ENLIVE, (ENSURE ENLIVE) LIQD Take 237 mLs by mouth 2 (two) times daily between meals.    . ferrous sulfate 325 (65 FE) MG tablet Take 1 tablet (325 mg total) by mouth 2 (two) times daily with a meal. 60 tablet 0  . furosemide (LASIX) 20 MG tablet Take 20 mg by mouth daily.    Marland Kitchen gabapentin (NEURONTIN) 300 MG capsule Take 300 mg by mouth daily.    . Melatonin 5 MG CAPS Take 1 capsule by mouth at bedtime as needed (sleep).    . midodrine (PROAMATINE) 5 MG tablet Take 5 mg by mouth 3 (three) times daily with meals.    . mirabegron ER (MYRBETRIQ) 25 MG TB24 tablet Take 25 mg by mouth daily.    . nitroGLYCERIN (NITROSTAT) 0.4 MG SL tablet Place 1 tablet (0.4 mg total) under the tongue every 5 (five) minutes as needed for chest pain. Up to 3 doses 25 tablet 3  . omeprazole (PRILOSEC) 20 MG capsule Take 20 mg by mouth 2 (two) times daily before a meal.    . ondansetron (ZOFRAN) 4 MG tablet Take 4 mg by mouth every 6 (six) hours as needed for nausea or vomiting.    . polyethylene glycol (MIRALAX / GLYCOLAX) packet Take 17 g by mouth daily as needed (constipation).     . potassium chloride (K-DUR,KLOR-CON) 10 MEQ tablet Take 10 mEq by mouth 2 (two) times daily.    . Rivaroxaban (XARELTO) 15 MG TABS tablet Take 15 mg by mouth daily with supper.     . senna (SENOKOT) 8.6 MG tablet Take 1 tablet by mouth daily as needed for constipation.    Marland Kitchen spironolactone (ALDACTONE) 25 MG tablet  Take 12.5 mg by mouth daily. Take one-half tablet (12.5mg )  daily    . traMADol (ULTRAM) 50 MG tablet Take 50 mg by mouth every 6 (six) hours as needed. Take 1-2 tablets (50-100mg ) every 4 hours as needed for pain    . traZODone (DESYREL) 50 MG tablet Take 50 mg by mouth at bedtime.     No current facility-administered medications for this visit.    Allergies:   Review of patient's allergies indicates no known allergies.  Social History:  The patient  reports that she has been smoking.  She has never used smokeless tobacco. She reports that she does not drink alcohol or use illicit drugs.   Family History:  The patient's family history includes Heart disease in her father; Lung cancer in her mother; Throat cancer in her brother.    ROS:  Please see the history of present illness.   All other systems are reviewed and negative.    PHYSICAL EXAM: VS:  BP 102/50 mmHg  Pulse 56  Ht 5\' 2"  (1.575 m)  Wt 107 lb 12.8 oz (48.898 kg)  BMI 19.71 kg/m2  SpO2 97% , BMI Body mass index is 19.71 kg/(m^2). GEN: Thin, frail, chronically ill appearing female in no acute distress HEENT: normal Neck: no JVD or masses, bilateral carotid bruits Cardiac: RRR; 3/6 SEM at apex Respiratory:  clear to auscultation bilaterally, normal work of breathing GI: soft, nontender, nondistended, + BS MS: no deformity or atrophy Skin: warm and dry, multiple healed lesions on upper extremities.  Neuro:  Strength and sensation are intact Psych: euthymic mood, full affect  EKG:  EKG is not ordered today.   Recent Labs: 07/04/2015: Magnesium 1.7 07/05/2015: ALT 25 07/27/2015: BUN 12; Creat 0.81; Hemoglobin 9.7*; Platelets 97*; Potassium 3.7; Sodium 141   Wt Readings from Last 3 Encounters:  07/27/15 107 lb 12.8 oz (48.898 kg)  07/23/15 109 lb (49.442 kg)  07/05/15 116 lb 2.9 oz (52.7 kg)      Other studies Reviewed: Additional studies/ records that were reviewed today include: Dr Macky Lower office notes, hospital  records, records from [place]: 1.  Persistent atrial fibrillation I have seen Nicole Randall is a 70 y.o. female in the office today who has been referred by Dr Curt Bears for a Watchman left atrial appendage closure device.  She has a history of persistent atrial fibrillation.  This patients CHA2DS2-VASc Score and unadjusted Ischemic Stroke Rate (% per year) is equal to 7.2 % stroke rate/year from a score of 5 which necessitates long term oral anticoagulation to prevent stroke. She has had significant GI bleed while on Eliquis and ASA.  She has been resumed on Xarelto post discharge. With significant co-morbidities, she is at high risk for any procedure. Would recommend continuing to hold ASA and follow Hgb closely for now on Xarelto.    2.  Chronic systolic heart failure Medical therapy limited by hypotension Euvolemic on exam today  3.  CAD  S/p CABG  No recent ischemic symptoms With GI bleed, risks for ASA outweigh benefits at this time    4.  Carotid disease She is s/p L CEA and has known significant right carotid stenosis Will repeat carotids at this time  Patient seen, examined. Available data reviewed. Agree with findings, assessment, and plan as outlined by Chanetta Marshall, NP whose documentation is above. I have independently interviewed and examined the patient. I have made changes above where appropriate. On my examination, the patient is a frail-appearing elderly woman in no acute distress. HEENT is normal. JVP is normal. There are bilateral carotid bruits. Lung fields are clear bilaterally. Heart is regular rate and rhythm with a 3/6 holosystolic murmur at the apex. Abdomen is soft and nontender, thin. Extremities are without edema. Pedal pulses are nonpalpable.  The patient has a very complex cardiovascular history. She's had extensive coronary artery disease. She has atrial fibrillation with bleeding problems as outlined above on chronic anticoagulation. Watchman  would be a reasonable  consideration, but with her multiple comorbid conditions I think it is best to manage her conservatively at the present time. Also of significance, she apparently has a 90% carotid stenosis that has not been treated because of her high risk of surgery or stenting. I am going to check a carotid ultrasound study to reassess her carotid disease. In addition, she has developed progressive bilateral leg pain and weakness with ambulation. She has an abnormal pulse exam. She has apparently had severe distal aortic stenosis in the past and underwent a remote angioplasty procedure. Will check an abdominal aortic ultrasound to evaluate for aortoiliac occlusive disease.  Sherren Mocha, M.D. 08/01/2015 1:14 PM  Follow-up:  Dr Burt Knack 3 months   Current medicines are reviewed at length with the patient today.   The patient does not have concerns regarding her medicines.  The following changes were made today:  none  Labs/ tests ordered today include: CBC, BMET   Signed, Sherren Mocha, MD  [facility] [address] [place] [address] [phone] (office) [phone] (fax)

## 2015-07-27 NOTE — Patient Instructions (Addendum)
Medication Instructions:  Your physician recommends that you continue on your current medications as directed. Please refer to the Current Medication list given to you today.  Labwork: Your physician recommends that you have lab work today: BMP and CBC  Testing/Procedures: Your physician has requested that you have an ankle brachial index (ABI). During this test an ultrasound and blood pressure cuff are used to evaluate the arteries that supply the arms and legs with blood. Allow thirty minutes for this exam. There are no restrictions or special instructions.  Your physician has requested that you have a carotid duplex. This test is an ultrasound of the carotid arteries in your neck. It looks at blood flow through these arteries that supply the brain with blood. Allow one hour for this exam. There are no restrictions or special instructions.  Your physician has requested that you have an aorto-iliac duplex. During this test, an ultrasound is used to evaluate the aorta and iliac arteries. Allow 30 minutes for this exam. Do not eat after midnight the day before and avoid carbonated beverages  Follow-Up: Your physician recommends that you schedule a follow-up appointment in: 3 MONTHS with Dr Burt Knack   Any Other Special Instructions Will Be Listed Below (If Applicable).     If you need a refill on your cardiac medications before your next appointment, please call your pharmacy.

## 2015-07-28 LAB — CBC
HEMATOCRIT: 31.7 % — AB (ref 36.0–46.0)
Hemoglobin: 9.7 g/dL — ABNORMAL LOW (ref 12.0–15.0)
MCH: 26.5 pg (ref 26.0–34.0)
MCHC: 30.6 g/dL (ref 30.0–36.0)
MCV: 86.6 fL (ref 78.0–100.0)
PLATELETS: 97 10*3/uL — AB (ref 150–400)
RBC: 3.66 MIL/uL — AB (ref 3.87–5.11)
RDW: 22.3 % — ABNORMAL HIGH (ref 11.5–15.5)
WBC: 4.9 10*3/uL (ref 4.0–10.5)

## 2015-07-31 ENCOUNTER — Telehealth: Payer: Self-pay | Admitting: Cardiovascular Disease

## 2015-07-31 NOTE — Telephone Encounter (Signed)
Is asking for Labs to be faxed to them at [phone] and to her PCP at [phone] Dr. Cleatis Polka. Please call if you have any questions. Wanted also know if Dr. Burt Knack wanted to get Home [facility] , started and weekly labs , they will be glad to do so . Please call if you have any questions .   Thanks

## 2015-07-31 NOTE — Telephone Encounter (Signed)
Lab results faxed per April's request.  No new home health orders at this time from Dr Burt Knack.  I left a message on April's confidential voicemail with this information.

## 2015-07-31 NOTE — Telephone Encounter (Signed)
Follow up   Pt is calling for her lab results

## 2015-07-31 NOTE — Telephone Encounter (Signed)
I spoke with the pt and made her aware of BMP and CBC results.  I made her aware that home health had contacted our office to have her lab results faxed and she said that this is fine.

## 2015-08-01 ENCOUNTER — Encounter: Payer: Self-pay | Admitting: *Deleted

## 2015-08-03 ENCOUNTER — Other Ambulatory Visit: Payer: Self-pay | Admitting: Cardiovascular Disease

## 2015-08-03 ENCOUNTER — Ambulatory Visit (HOSPITAL_COMMUNITY)
Admission: RE | Admit: 2015-08-03 | Discharge: 2015-08-03 | Disposition: A | Discharge status: Home / Self Care | Payer: MEDICARE | Source: Ambulatory Visit | Attending: Cardiovascular Disease | Admitting: Cardiovascular Disease

## 2015-08-03 DIAGNOSIS — I739 Peripheral vascular disease, unspecified: Secondary | ICD-10-CM

## 2015-08-03 DIAGNOSIS — I700 Atherosclerosis of aorta: Secondary | ICD-10-CM | POA: Insufficient documentation

## 2015-08-03 DIAGNOSIS — I6523 Occlusion and stenosis of bilateral carotid arteries: Secondary | ICD-10-CM | POA: Diagnosis not present

## 2015-08-03 DIAGNOSIS — I110 Hypertensive heart disease with heart failure: Secondary | ICD-10-CM | POA: Diagnosis not present

## 2015-08-03 DIAGNOSIS — I509 Heart failure, unspecified: Secondary | ICD-10-CM | POA: Diagnosis not present

## 2015-08-03 DIAGNOSIS — I708 Atherosclerosis of other arteries: Secondary | ICD-10-CM | POA: Diagnosis not present

## 2015-08-03 DIAGNOSIS — I6529 Occlusion and stenosis of unspecified carotid artery: Secondary | ICD-10-CM

## 2015-08-06 ENCOUNTER — Encounter: Payer: Self-pay | Admitting: Internal Medicine

## 2015-08-08 ENCOUNTER — Encounter: Payer: Self-pay | Admitting: Cardiovascular Disease

## 2015-08-08 NOTE — Telephone Encounter (Signed)
This encounter was created in error - please disregard.

## 2015-08-08 NOTE — Telephone Encounter (Signed)
Follow up:   Returning your call,concerning her test results from last week.

## 2015-08-13 ENCOUNTER — Telehealth: Payer: Self-pay | Admitting: Cardiovascular Disease

## 2015-08-13 NOTE — Telephone Encounter (Signed)
New message      Calling to ask the nurse if she sent the ultrasound of her legs report to [facility]?

## 2015-08-13 NOTE — Telephone Encounter (Signed)
I spoke with the pt and she would like her vascular results faxed to [phone], Attn: Belia Heman. Records faxed.

## 2015-08-16 ENCOUNTER — Telehealth (HOSPITAL_COMMUNITY): Payer: Self-pay | Admitting: Vascular Surgery

## 2015-08-16 NOTE — Telephone Encounter (Signed)
Left pt message to make New pt appt 

## 2015-08-22 ENCOUNTER — Inpatient Hospital Stay (HOSPITAL_COMMUNITY)
Admission: EM | Admit: 2015-08-22 | Discharge: 2015-08-28 | DRG: 871 | Disposition: A | Discharge status: Home Health | Payer: MEDICARE | Attending: Internal Medicine | Admitting: Internal Medicine

## 2015-08-22 ENCOUNTER — Emergency Department (HOSPITAL_COMMUNITY): Payer: MEDICARE

## 2015-08-22 ENCOUNTER — Encounter (HOSPITAL_COMMUNITY): Payer: Self-pay | Admitting: Emergency Medicine

## 2015-08-22 ENCOUNTER — Inpatient Hospital Stay (HOSPITAL_COMMUNITY): Payer: MEDICARE

## 2015-08-22 DIAGNOSIS — Z681 Body mass index (BMI) 19 or less, adult: Secondary | ICD-10-CM | POA: Diagnosis not present

## 2015-08-22 DIAGNOSIS — R531 Weakness: Secondary | ICD-10-CM

## 2015-08-22 DIAGNOSIS — A4151 Sepsis due to Escherichia coli [E. coli]: Secondary | ICD-10-CM

## 2015-08-22 DIAGNOSIS — G8929 Other chronic pain: Secondary | ICD-10-CM | POA: Diagnosis present

## 2015-08-22 DIAGNOSIS — G4733 Obstructive sleep apnea (adult) (pediatric): Secondary | ICD-10-CM | POA: Diagnosis present

## 2015-08-22 DIAGNOSIS — I5023 Acute on chronic systolic (congestive) heart failure: Secondary | ICD-10-CM | POA: Diagnosis present

## 2015-08-22 DIAGNOSIS — D696 Thrombocytopenia, unspecified: Secondary | ICD-10-CM | POA: Diagnosis present

## 2015-08-22 DIAGNOSIS — Z9861 Coronary angioplasty status: Secondary | ICD-10-CM | POA: Diagnosis not present

## 2015-08-22 DIAGNOSIS — Z951 Presence of aortocoronary bypass graft: Secondary | ICD-10-CM

## 2015-08-22 DIAGNOSIS — K921 Melena: Secondary | ICD-10-CM | POA: Diagnosis present

## 2015-08-22 DIAGNOSIS — I480 Paroxysmal atrial fibrillation: Secondary | ICD-10-CM | POA: Diagnosis present

## 2015-08-22 DIAGNOSIS — K5731 Diverticulosis of large intestine without perforation or abscess with bleeding: Secondary | ICD-10-CM | POA: Diagnosis present

## 2015-08-22 DIAGNOSIS — I481 Persistent atrial fibrillation: Secondary | ICD-10-CM | POA: Diagnosis present

## 2015-08-22 DIAGNOSIS — N390 Urinary tract infection, site not specified: Secondary | ICD-10-CM

## 2015-08-22 DIAGNOSIS — R0600 Dyspnea, unspecified: Secondary | ICD-10-CM | POA: Diagnosis not present

## 2015-08-22 DIAGNOSIS — I2510 Atherosclerotic heart disease of native coronary artery without angina pectoris: Secondary | ICD-10-CM | POA: Diagnosis present

## 2015-08-22 DIAGNOSIS — I5022 Chronic systolic (congestive) heart failure: Secondary | ICD-10-CM

## 2015-08-22 DIAGNOSIS — Z7901 Long term (current) use of anticoagulants: Secondary | ICD-10-CM

## 2015-08-22 DIAGNOSIS — J939 Pneumothorax, unspecified: Secondary | ICD-10-CM

## 2015-08-22 DIAGNOSIS — Z79899 Other long term (current) drug therapy: Secondary | ICD-10-CM | POA: Diagnosis not present

## 2015-08-22 DIAGNOSIS — I739 Peripheral vascular disease, unspecified: Secondary | ICD-10-CM | POA: Diagnosis present

## 2015-08-22 DIAGNOSIS — M810 Age-related osteoporosis without current pathological fracture: Secondary | ICD-10-CM | POA: Diagnosis present

## 2015-08-22 DIAGNOSIS — Z90710 Acquired absence of both cervix and uterus: Secondary | ICD-10-CM | POA: Diagnosis not present

## 2015-08-22 DIAGNOSIS — A419 Sepsis, unspecified organism: Secondary | ICD-10-CM | POA: Diagnosis present

## 2015-08-22 DIAGNOSIS — R112 Nausea with vomiting, unspecified: Secondary | ICD-10-CM

## 2015-08-22 DIAGNOSIS — K625 Hemorrhage of anus and rectum: Secondary | ICD-10-CM | POA: Diagnosis not present

## 2015-08-22 DIAGNOSIS — K3184 Gastroparesis: Secondary | ICD-10-CM | POA: Diagnosis present

## 2015-08-22 DIAGNOSIS — I248 Other forms of acute ischemic heart disease: Secondary | ICD-10-CM | POA: Diagnosis present

## 2015-08-22 DIAGNOSIS — K5900 Constipation, unspecified: Secondary | ICD-10-CM

## 2015-08-22 DIAGNOSIS — B9620 Unspecified Escherichia coli [E. coli] as the cause of diseases classified elsewhere: Secondary | ICD-10-CM | POA: Diagnosis present

## 2015-08-22 DIAGNOSIS — E876 Hypokalemia: Secondary | ICD-10-CM | POA: Diagnosis present

## 2015-08-22 DIAGNOSIS — K219 Gastro-esophageal reflux disease without esophagitis: Secondary | ICD-10-CM | POA: Diagnosis not present

## 2015-08-22 DIAGNOSIS — J9811 Atelectasis: Secondary | ICD-10-CM | POA: Diagnosis present

## 2015-08-22 DIAGNOSIS — I110 Hypertensive heart disease with heart failure: Secondary | ICD-10-CM | POA: Diagnosis present

## 2015-08-22 DIAGNOSIS — R339 Retention of urine, unspecified: Secondary | ICD-10-CM | POA: Diagnosis not present

## 2015-08-22 DIAGNOSIS — Z515 Encounter for palliative care: Secondary | ICD-10-CM

## 2015-08-22 DIAGNOSIS — E43 Unspecified severe protein-calorie malnutrition: Secondary | ICD-10-CM | POA: Diagnosis present

## 2015-08-22 DIAGNOSIS — F17210 Nicotine dependence, cigarettes, uncomplicated: Secondary | ICD-10-CM | POA: Diagnosis present

## 2015-08-22 DIAGNOSIS — I255 Ischemic cardiomyopathy: Secondary | ICD-10-CM | POA: Diagnosis present

## 2015-08-22 DIAGNOSIS — Z66 Do not resuscitate: Secondary | ICD-10-CM | POA: Diagnosis not present

## 2015-08-22 DIAGNOSIS — E785 Hyperlipidemia, unspecified: Secondary | ICD-10-CM | POA: Diagnosis present

## 2015-08-22 DIAGNOSIS — G894 Chronic pain syndrome: Secondary | ICD-10-CM

## 2015-08-22 DIAGNOSIS — Z8249 Family history of ischemic heart disease and other diseases of the circulatory system: Secondary | ICD-10-CM | POA: Diagnosis not present

## 2015-08-22 DIAGNOSIS — R1110 Vomiting, unspecified: Secondary | ICD-10-CM | POA: Insufficient documentation

## 2015-08-22 DIAGNOSIS — Z9049 Acquired absence of other specified parts of digestive tract: Secondary | ICD-10-CM | POA: Diagnosis not present

## 2015-08-22 LAB — CBC WITH DIFFERENTIAL/PLATELET
Basophils Absolute: 0 10*3/uL (ref 0.0–0.1)
Basophils Relative: 0 %
EOS PCT: 0 %
Eosinophils Absolute: 0 10*3/uL (ref 0.0–0.7)
HCT: 37.3 % (ref 36.0–46.0)
Hemoglobin: 12.4 g/dL (ref 12.0–15.0)
LYMPHS ABS: 0.7 10*3/uL (ref 0.7–4.0)
Lymphocytes Relative: 3 %
MCH: 29.8 pg (ref 26.0–34.0)
MCHC: 33.2 g/dL (ref 30.0–36.0)
MCV: 89.7 fL (ref 78.0–100.0)
MONO ABS: 1.1 10*3/uL — AB (ref 0.1–1.0)
Monocytes Relative: 5 %
Neutro Abs: 20.9 10*3/uL — ABNORMAL HIGH (ref 1.7–7.7)
Neutrophils Relative %: 92 %
PLATELETS: 94 10*3/uL — AB (ref 150–400)
RBC: 4.16 MIL/uL (ref 3.87–5.11)
RDW: 23.4 % — AB (ref 11.5–15.5)
WBC: 22.7 10*3/uL — AB (ref 4.0–10.5)

## 2015-08-22 LAB — URINALYSIS, ROUTINE W REFLEX MICROSCOPIC
Bilirubin Urine: NEGATIVE
GLUCOSE, UA: NEGATIVE mg/dL
Ketones, ur: NEGATIVE mg/dL
NITRITE: POSITIVE — AB
PROTEIN: NEGATIVE mg/dL
Specific Gravity, Urine: 1.012 (ref 1.005–1.030)
pH: 7.5 (ref 5.0–8.0)

## 2015-08-22 LAB — TYPE AND SCREEN
ABO/RH(D): O POS
Antibody Screen: NEGATIVE

## 2015-08-22 LAB — ABO/RH: ABO/RH(D): O POS

## 2015-08-22 LAB — I-STAT CG4 LACTIC ACID, ED
Lactic Acid, Venous: 2.28 mmol/L (ref 0.5–2.0)
Lactic Acid, Venous: 1.23 mmol/L (ref 0.5–2.0)

## 2015-08-22 LAB — COMPREHENSIVE METABOLIC PANEL
ALK PHOS: 93 U/L (ref 38–126)
ALT: 26 U/L (ref 14–54)
ANION GAP: 10 (ref 5–15)
AST: 49 U/L — ABNORMAL HIGH (ref 15–41)
Albumin: 3.2 g/dL — ABNORMAL LOW (ref 3.5–5.0)
BILIRUBIN TOTAL: 1.1 mg/dL (ref 0.3–1.2)
BUN: 19 mg/dL (ref 6–20)
CALCIUM: 9.5 mg/dL (ref 8.9–10.3)
CO2: 26 mmol/L (ref 22–32)
Chloride: 104 mmol/L (ref 101–111)
Creatinine, Ser: 1.12 mg/dL — ABNORMAL HIGH (ref 0.44–1.00)
GFR, EST AFRICAN AMERICAN: 56 mL/min — AB (ref >60)
GFR, EST NON AFRICAN AMERICAN: 49 mL/min — AB (ref >60)
GLUCOSE: 109 mg/dL — AB (ref 65–99)
Potassium: 4.3 mmol/L (ref 3.5–5.1)
Sodium: 140 mmol/L (ref 135–145)
TOTAL PROTEIN: 7.5 g/dL (ref 6.5–8.1)

## 2015-08-22 LAB — LIPASE, BLOOD: Lipase: 19 U/L (ref 11–51)

## 2015-08-22 LAB — URINE MICROSCOPIC-ADD ON

## 2015-08-22 LAB — HEPATIC FUNCTION PANEL
ALT: 32 U/L (ref 14–54)
AST: 67 U/L — AB (ref 15–41)
Albumin: 2.4 g/dL — ABNORMAL LOW (ref 3.5–5.0)
Alkaline Phosphatase: 79 U/L (ref 38–126)
BILIRUBIN DIRECT: 0.2 mg/dL (ref 0.1–0.5)
Indirect Bilirubin: 0.6 mg/dL (ref 0.3–0.9)
Total Bilirubin: 0.8 mg/dL (ref 0.3–1.2)
Total Protein: 5.9 g/dL — ABNORMAL LOW (ref 6.5–8.1)

## 2015-08-22 LAB — MRSA PCR SCREENING: MRSA by PCR: NEGATIVE

## 2015-08-22 LAB — PROTIME-INR
INR: 1.46 (ref 0.00–1.49)
Prothrombin Time: 17.8 seconds — ABNORMAL HIGH (ref 11.6–15.2)

## 2015-08-22 LAB — PROCALCITONIN: Procalcitonin: 12.72 ng/mL

## 2015-08-22 LAB — FIBRINOGEN: Fibrinogen: 378 mg/dL (ref 204–475)

## 2015-08-22 LAB — LACTIC ACID, PLASMA
Lactic Acid, Venous: 1.6 mmol/L (ref 0.5–2.0)
Lactic Acid, Venous: 2.1 mmol/L (ref 0.5–2.0)

## 2015-08-22 LAB — TROPONIN I: TROPONIN I: 0.1 ng/mL — AB (ref <0.031)

## 2015-08-22 LAB — APTT: APTT: 34 s (ref 24–37)

## 2015-08-22 MED ORDER — IOHEXOL 300 MG/ML  SOLN
50.0000 mL | Freq: Once | INTRAMUSCULAR | Status: AC | PRN
  Administered 2015-08-22: 50 mL via ORAL

## 2015-08-22 MED ORDER — FERROUS SULFATE 325 (65 FE) MG PO TABS
325.0000 mg | ORAL_TABLET | Freq: Two times a day (BID) | ORAL | Status: DC
  Administered 2015-08-23 – 2015-08-28 (×12): 325 mg via ORAL
  Filled 2015-08-22 (×12): qty 1

## 2015-08-22 MED ORDER — ACETAMINOPHEN 500 MG PO TABS
500.0000 mg | ORAL_TABLET | Freq: Four times a day (QID) | ORAL | Status: DC | PRN
  Administered 2015-08-27: 500 mg via ORAL
  Filled 2015-08-22: qty 1

## 2015-08-22 MED ORDER — OXYCODONE-ACETAMINOPHEN 10-325 MG PO TABS: 1.0000 | ORAL_TABLET | Freq: Three times a day (TID) | ORAL | Status: DC

## 2015-08-22 MED ORDER — MELATONIN 5 MG PO CAPS: 1.0000 | ORAL_CAPSULE | Freq: Every evening | ORAL | Status: DC | PRN

## 2015-08-22 MED ORDER — POLYETHYLENE GLYCOL 3350 17 G PO PACK
17.0000 g | PACK | Freq: Every day | ORAL | Status: DC | PRN
  Administered 2015-08-26: 17 g via ORAL

## 2015-08-22 MED ORDER — ENSURE ENLIVE PO LIQD
237.0000 mL | Freq: Two times a day (BID) | ORAL | Status: DC
  Administered 2015-08-23 – 2015-08-28 (×11): 237 mL via ORAL

## 2015-08-22 MED ORDER — RIVAROXABAN 15 MG PO TABS
15.0000 mg | ORAL_TABLET | Freq: Every day | ORAL | Status: DC
  Administered 2015-08-22: 15 mg via ORAL
  Filled 2015-08-22: qty 1

## 2015-08-22 MED ORDER — GABAPENTIN 300 MG PO CAPS
300.0000 mg | ORAL_CAPSULE | Freq: Two times a day (BID) | ORAL | Status: DC
  Administered 2015-08-22 – 2015-08-28 (×12): 300 mg via ORAL
  Filled 2015-08-22 (×13): qty 1

## 2015-08-22 MED ORDER — SODIUM CHLORIDE 0.9 % IV BOLUS (SEPSIS)
1000.0000 mL | Freq: Once | INTRAVENOUS | Status: DC
Start: 2015-08-22 — End: 2015-08-22
  Administered 2015-08-22: 1000 mL via INTRAVENOUS

## 2015-08-22 MED ORDER — PIPERACILLIN-TAZOBACTAM 3.375 G IVPB: 3.3750 g | Freq: Three times a day (TID) | INTRAVENOUS | Status: DC

## 2015-08-22 MED ORDER — NITROGLYCERIN 0.4 MG SL SUBL: 0.4000 mg | SUBLINGUAL_TABLET | SUBLINGUAL | Status: DC | PRN

## 2015-08-22 MED ORDER — PIPERACILLIN-TAZOBACTAM 3.375 G IVPB 30 MIN
3.3750 g | Freq: Once | INTRAVENOUS | Status: AC
  Administered 2015-08-22: 3.375 g via INTRAVENOUS
  Filled 2015-08-22: qty 50

## 2015-08-22 MED ORDER — TRAZODONE HCL 50 MG PO TABS
50.0000 mg | ORAL_TABLET | Freq: Every day | ORAL | Status: DC
  Administered 2015-08-22 – 2015-08-27 (×6): 50 mg via ORAL
  Filled 2015-08-22 (×8): qty 1

## 2015-08-22 MED ORDER — SENNA 8.6 MG PO TABS: 1.0000 | ORAL_TABLET | Freq: Every day | ORAL | Status: DC | PRN

## 2015-08-22 MED ORDER — SODIUM CHLORIDE 0.9 % IV SOLN
1000.0000 mL | Freq: Once | INTRAVENOUS | Status: AC
  Administered 2015-08-22: 1000 mL via INTRAVENOUS

## 2015-08-22 MED ORDER — SODIUM CHLORIDE 0.9 % IV BOLUS (SEPSIS)
500.0000 mL | Freq: Once | INTRAVENOUS | Status: AC
  Administered 2015-08-22: 500 mL via INTRAVENOUS

## 2015-08-22 MED ORDER — ONDANSETRON HCL 4 MG/2ML IJ SOLN
4.0000 mg | Freq: Four times a day (QID) | INTRAMUSCULAR | Status: DC | PRN
  Administered 2015-08-26: 4 mg via INTRAVENOUS
  Filled 2015-08-22: qty 2

## 2015-08-22 MED ORDER — BUSPIRONE HCL 15 MG PO TABS
7.5000 mg | ORAL_TABLET | Freq: Two times a day (BID) | ORAL | Status: DC
  Administered 2015-08-22 – 2015-08-28 (×12): 7.5 mg via ORAL
  Filled 2015-08-22 (×14): qty 1

## 2015-08-22 MED ORDER — OXYCODONE HCL 5 MG PO TABS
5.0000 mg | ORAL_TABLET | Freq: Three times a day (TID) | ORAL | Status: DC
Start: 2015-08-22 — End: 2015-08-28
  Administered 2015-08-22 – 2015-08-28 (×18): 5 mg via ORAL
  Filled 2015-08-22 (×18): qty 1

## 2015-08-22 MED ORDER — ACETAMINOPHEN 325 MG PO TABS
650.0000 mg | ORAL_TABLET | Freq: Once | ORAL | Status: AC
  Administered 2015-08-22: 650 mg via ORAL
  Filled 2015-08-22: qty 2

## 2015-08-22 MED ORDER — TRAMADOL HCL 50 MG PO TABS
50.0000 mg | ORAL_TABLET | Freq: Four times a day (QID) | ORAL | Status: DC | PRN
Start: 2015-08-22 — End: 2015-08-28
  Administered 2015-08-25: 50 mg via ORAL
  Filled 2015-08-22: qty 1

## 2015-08-22 MED ORDER — ONDANSETRON HCL 4 MG PO TABS: 4.0000 mg | ORAL_TABLET | Freq: Four times a day (QID) | ORAL | Status: DC | PRN

## 2015-08-22 MED ORDER — ATORVASTATIN CALCIUM 80 MG PO TABS
80.0000 mg | ORAL_TABLET | Freq: Every day | ORAL | Status: DC
  Administered 2015-08-22 – 2015-08-28 (×7): 80 mg via ORAL
  Filled 2015-08-22 (×2): qty 1
  Filled 2015-08-22 (×4): qty 2
  Filled 2015-08-22: qty 1

## 2015-08-22 MED ORDER — PANTOPRAZOLE SODIUM 40 MG IV SOLR
40.0000 mg | Freq: Once | INTRAVENOUS | Status: AC
  Administered 2015-08-22: 40 mg via INTRAVENOUS
  Filled 2015-08-22: qty 40

## 2015-08-22 MED ORDER — ALBUTEROL SULFATE (2.5 MG/3ML) 0.083% IN NEBU: 2.5000 mg | INHALATION_SOLUTION | Freq: Four times a day (QID) | RESPIRATORY_TRACT | Status: DC | PRN

## 2015-08-22 MED ORDER — IOPAMIDOL (ISOVUE-300) INJECTION 61%
100.0000 mL | Freq: Once | INTRAVENOUS | Status: AC | PRN
  Administered 2015-08-22: 80 mL via INTRAVENOUS

## 2015-08-22 MED ORDER — CALCIUM CARBONATE ANTACID 500 MG PO CHEW: 1.0000 | CHEWABLE_TABLET | Freq: Three times a day (TID) | ORAL | Status: DC | PRN

## 2015-08-22 MED ORDER — SODIUM CHLORIDE 0.9% FLUSH
3.0000 mL | Freq: Two times a day (BID) | INTRAVENOUS | Status: DC
  Administered 2015-08-22 – 2015-08-28 (×11): 3 mL via INTRAVENOUS

## 2015-08-22 MED ORDER — SODIUM CHLORIDE 0.9 % IV BOLUS (SEPSIS)
1000.0000 mL | INTRAVENOUS | Status: AC
  Administered 2015-08-22 (×2): 1000 mL via INTRAVENOUS

## 2015-08-22 MED ORDER — OXYCODONE-ACETAMINOPHEN 5-325 MG PO TABS
1.0000 | ORAL_TABLET | Freq: Three times a day (TID) | ORAL | Status: DC
  Administered 2015-08-22 – 2015-08-28 (×18): 1 via ORAL
  Filled 2015-08-22 (×18): qty 1

## 2015-08-22 MED ORDER — SODIUM CHLORIDE 0.9 % IV SOLN
1000.0000 mL | INTRAVENOUS | Status: DC
  Administered 2015-08-22 – 2015-08-23 (×3): 1000 mL via INTRAVENOUS

## 2015-08-22 MED ORDER — MIDODRINE HCL 5 MG PO TABS
5.0000 mg | ORAL_TABLET | Freq: Three times a day (TID) | ORAL | Status: DC
  Administered 2015-08-23 – 2015-08-28 (×17): 5 mg via ORAL
  Filled 2015-08-22 (×18): qty 1

## 2015-08-22 MED ORDER — GLYCERIN (LAXATIVE) 2.1 G RE SUPP
1.0000 | Freq: Once | RECTAL | Status: DC
  Filled 2015-08-22: qty 1

## 2015-08-22 MED ORDER — DEXTROSE 5 % IV SOLN
1.0000 g | INTRAVENOUS | Status: DC
  Administered 2015-08-22 – 2015-08-23 (×2): 1 g via INTRAVENOUS
  Filled 2015-08-22 (×2): qty 1

## 2015-08-22 NOTE — ED Notes (Signed)
Patient states that emesis started at 0400. She is from facility where she is normal active and alert and oriented. No other complaints.

## 2015-08-22 NOTE — H&P (Signed)
History and Physical  Nicole Randall [id number] DOB: 1946-05-13 DOA: 08/22/2015   PCP: Gildardo Cranker, DO  Referring Physician: ED/ Dr. Johnney Killian  Chief Complaint: N/V  HPI:  70 year old female with a history of persistent atrial fibrillation on Xarelto, systolic CHF, hypertension, coronary artery disease, peripheral vascular disease, hyperlipidemia, anxiety presented with intractable nausea and vomiting on the morning of admission. The patient woke up having numerous episodes of nausea and vomiting approximately 4:30 AM on 08/22/2015. It resolved for short period of time, but around 7:30 AM, the patient began having 3-4 episodes of emesis again. There was no hematemesis or coffee grounds material. The patient has subjective fevers and chills but denied any chest pain,  Shortness breath, coughing, hemoptysis, diarrhea, hematochezia, melena. She denies any unusual rashes.  Notably, the patient was recently discharged from the hospital after a stay between 07/04/2015 through 07/09/2015 for upper GI bleed  And acute blood loss anemia. Colonoscopy on  07/09/2015 revealed diverticulosis and EGD on 07/06/2015 suggested gastroparesis.  The patient's rivaroxaban was restarted and aspirin was discontinued.  In the emergency department, the patient was noted to have WBC 22.7, platelets 94,000 with fever 101.69F. The patienthad soft blood pressures with systolic blood pressures in the 90s. The patient was given  30 mL/kg IV fluid bolus and started on Zosyn.  Lactic acid was 2.88. Serum creatinine was 1.12. Urinalysis showed TNTC WBC Assessment/Plan: Sepsis -Secondary to genitourinary source -Continue cefepime pending culture data -Blood cultures 2 sets -Urine culture -Chest x-ray -Lactic acid 2.88 -Procalcitonin -Continue IV fluids  Pyuria/suprapubic pain -Continue cefepime pending culture data  Intractable nausea and vomiting -CT abdomen and pelvis without contrast -Lipase   19  Paroxysmal atrial fibrillation -Continue xarelto -Patient was not deemed to be a good candidate for Watchman device due to her comorbidities -CHADS-VASc = 5  Chronic systolic CHF -[id number] echo EF 35-40 percent -Clinically compensated at this time -hold Aldactone at this time secondary to hypotension  Severe malnutrition -Continue ensure  Thrombocytopenia -Suspect this is chronic -The patient had thrombocytopenia during her last admission -Check fibrinogen  Elevated troponin/CAD s/p CABG x 2  -No chest pain presently -Likely demand ischemia in the setting of sepsis -Continue Lipitor  Tobacco abuse -Still smoking. 4 cigarettes per day -Patient has nearly 100-pack-year history -Tobacco cessation discussed      Past Medical History  Diagnosis Date  . CHF (congestive heart failure) ([place])   . Persistent atrial fibrillation ([place])   . Bone spur   . Arthritis   . Coronary artery disease     a. s/p CABG x2  . Hypertension   . OSA on CPAP   . Impaired memory   . Depression with anxiety   . Gait disturbance   . Osteoporosis   . GI bleed   . Carotid artery disease ([place])     a. s/p L CEA  . Mitral valvular regurgitation     a. moderate to severe  . PAD (peripheral artery disease) (HCC)     a. s/p PTA of left internal iliac  . Cancer [facility])     carcinoma left leg   Past Surgical History  Procedure Laterality Date  . Bypass graft    . Back surgery    . Appendectomy    . Coronary angioplasty    . Cholecystectomy    . Tonsillectomy    . Abdominal hysterectomy      partial  . Eye surgery      bilat cataract,  lasix surgery  . Breast lumpectomy Left     1980's  . Esophagogastroduodenoscopy (egd) with propofol N/A 07/06/2015    Procedure: ESOPHAGOGASTRODUODENOSCOPY (EGD) WITH PROPOFOL;  Surgeon: Mauri Pole, MD;  Location: [facility] ENDOSCOPY;  Service: Endoscopy;  Laterality: N/A;  . Colonoscopy with propofol N/A 07/09/2015    Procedure: COLONOSCOPY WITH  PROPOFOL;  Surgeon: Doran Stabler, MD;  Location[facility] ENDOSCOPY;  Service: Endoscopy;  Laterality: N/A;   Social History:  reports that she has been smoking.  She has never used smokeless tobacco. She reports that she does not drink alcohol or use illicit drugs.   Family History  Problem Relation Age of Onset  . Lung cancer Mother   . Heart disease Father   . Throat cancer Brother      No Known Allergies    Prior to Admission medications   Medication Sig Start Date End Date Taking? Authorizing Provider  atorvastatin (LIPITOR) 80 MG tablet Take 80 mg by mouth daily.   Yes Historical Provider, MD  busPIRone (BUSPAR) 7.5 MG tablet Take 7.5 mg by mouth 2 (two) times daily.   Yes Historical Provider, MD  feeding supplement, ENSURE ENLIVE, (ENSURE ENLIVE) LIQD Take 237 mLs by mouth 2 (two) times daily between meals. 07/09/15  Yes Modena Jansky, MD  ferrous sulfate 325 (65 FE) MG tablet Take 1 tablet (325 mg total) by mouth 2 (two) times daily with a meal. 07/09/15  Yes Modena Jansky, MD  gabapentin (NEURONTIN) 300 MG capsule Take 300 mg by mouth 2 (two) times daily.    Yes Historical Provider, MD  midodrine (PROAMATINE) 5 MG tablet Take 5 mg by mouth 3 (three) times daily with meals.   Yes Historical Provider, MD  oxyCODONE-acetaminophen (PERCOCET) 10-325 MG tablet Take 1 tablet by mouth 3 (three) times daily.   Yes Historical Provider, MD  potassium chloride (K-DUR,KLOR-CON) 10 MEQ tablet Take 10 mEq by mouth 2 (two) times daily.   Yes Historical Provider, MD  Rivaroxaban (XARELTO) 15 MG TABS tablet Take 15 mg by mouth daily.    Yes Historical Provider, MD  spironolactone (ALDACTONE) 25 MG tablet Take 12.5 mg by mouth daily. Take one-half tablet (12.5mg )  daily   Yes Historical Provider, MD  traMADol (ULTRAM) 50 MG tablet Take 50 mg by mouth every 6 (six) hours as needed. Take 1-2 tablets (50-100mg ) every 4 hours as needed for pain   Yes Historical Provider, MD  traZODone (DESYREL) 50  MG tablet Take 50 mg by mouth at bedtime.   Yes Historical Provider, MD  acetaminophen (TYLENOL) 500 MG tablet Take 500 mg by mouth every 6 (six) hours as needed (body pain).    Historical Provider, MD  albuterol (PROVENTIL) (2.5 MG/3ML) 0.083% nebulizer solution Take 2.5 mg by nebulization every 6 (six) hours as needed for wheezing or shortness of breath.    Historical Provider, MD  calcium carbonate (TUMS EX) 750 MG chewable tablet Chew 1 tablet by mouth 3 (three) times daily as needed for heartburn.    Historical Provider, MD  Melatonin 5 MG CAPS Take 1 capsule by mouth at bedtime as needed (sleep).    Historical Provider, MD  nitroGLYCERIN (NITROSTAT) 0.4 MG SL tablet Place 1 tablet (0.4 mg total) under the tongue every 5 (five) minutes as needed for chest pain. Up to 3 doses 07/23/15   Imogene Burn, PA-C  polyethylene glycol [facility] / GLYCOLAX) packet Take 17 g by mouth daily as needed (constipation).  Historical Provider, MD  senna (SENOKOT) 8.6 MG tablet Take 1 tablet by mouth daily as needed for constipation.    Historical Provider, MD    Review of Systems:  Constitutional:  No weight loss, night sweats, Fevers, chills, fatigue.  Head&Eyes: No headache.  No vision loss.  No eye pain or scotoma ENT:  No Difficulty swallowing,Tooth/dental problems,Sore throat,  No ear ache, post nasal drip,  Cardio-vascular:  No chest pain, Orthopnea, PND, swelling in lower extremities,  dizziness, palpitations  GI:  No diarrhea, loss of appetite, hematochezia, melena, heartburn, indigestion, +suprapubic pain Resp:  No shortness of breath with exertion or at rest. No cough. No coughing up of blood .No wheezing.No chest wall deformity  Skin:  no rash or lesions.  GU:  no dysuria, change in color of urine, no urgency or frequency. No flank pain.  Musculoskeletal:  No joint pain or swelling. No decreased range of motion. No back pain.  Psych:  No change in mood or affect. No depression or  anxiety. Neurologic: No headache, no dysesthesia, no focal weakness, no vision loss. No syncope  Physical Exam: Filed Vitals:   08/22/15 1355 08/22/15 1400 08/22/15 1445 08/22/15 1500  BP: 94/62 117/54 97/50 97/74   Pulse: 86  76   Temp:      TempSrc:      Resp:  20 26 22   Height:      Weight:      SpO2: 93%  94%    General:  A&O x 3, NAD, nontoxic, pleasant/cooperative Head/Eye: No conjunctival hemorrhage, no icterus, [place]/AT, No nystagmus ENT:  No icterus,  No thrush, good dentition, no pharyngeal exudate Neck:  No masses, no lymphadenpathy, no bruits CV:  RRR, no rub, no gallop, no S3 Lung: bibasilar crackles, record the left. No wheezing Abdomen: soft/suprapubic pain., +BS, nondistended, no peritoneal signs Ext: No cyanosis, No rashes, No petechiae, No lymphangitis, No edema Neuro: CNII-XII intact, strength 4/5 in bilateral upper and lower extremities, no dysmetria  Labs on Admission:  Basic Metabolic Panel:  Recent Labs Lab 08/22/15 1310  NA 140  K 4.3  CL 104  CO2 26  GLUCOSE 109*  BUN 19  CREATININE 1.12*  CALCIUM 9.5   Liver Function Tests:  Recent Labs Lab 08/22/15 1310  AST 49*  ALT 26  ALKPHOS 93  BILITOT 1.1  PROT 7.5  ALBUMIN 3.2*    Recent Labs Lab 08/22/15 1310  LIPASE 19   No results for input(s): AMMONIA in the last 168 hours. CBC:  Recent Labs Lab 08/22/15 1310  WBC 22.7*  NEUTROABS 20.9*  HGB 12.4  HCT 37.3  MCV 89.7  PLT 94*   Cardiac Enzymes:  Recent Labs Lab 08/22/15 1310  TROPONINI 0.10*   BNP: Invalid input(s): POCBNP CBG: No results for input(s): GLUCAP in the last 168 hours.  Radiological Exams on Admission: No results found.  EKG: Independently reviewed. Sinus, wenchebach AVB    Time spent:70 minutes Code Status:   FULL Family Communication:   No Family at bedside   Nakima Fluegge, DO  [facility] Pager [phone]  If 7PM-7AM, please contact night-coverage [email or web address] Password  [email or web address] 08/22/2015, 3:50 PM

## 2015-08-22 NOTE — Progress Notes (Signed)
Pharmacy Antibiotic Note  Nicole Randall is a 70 y.o. female admitted on 08/22/2015 with nausea and emesis, sepsis and UTI.  Pharmacy has been consulted for Zosyn dosing.  Plan:  Zosyn 3.375g IV once over 30 min, then 3.375g IV Q8H infused over 4hrs.  Follow up renal fxn, culture results, and clinical course.  Height: 5\' 3"  (160 cm) Weight: 120 lb (54.432 kg) IBW/kg (Calculated) : 52.4  Temp (24hrs), Avg:101.3 F (38.5 C), Min:101 F (38.3 C), Max:101.6 F (38.7 C)   Recent Labs Lab 08/22/15 1310 08/22/15 1331  WBC 22.7*  --   CREATININE 1.12*  --   LATICACIDVEN  --  2.28*    Estimated Creatinine Clearance: 38.7 mL/min (by C-G formula based on Cr of 1.12).   No Known Allergies  Antimicrobials this admission: 3/29 Zosyn >>   Dose adjustments this admission:  Microbiology results: 3/29 BCx: sent 3/29 UCx: sent   Thank you for allowing pharmacy to be a part of this patient's care.  Gretta Arab PharmD, BCPS Pager [phone] 08/22/2015 1:40 PM

## 2015-08-22 NOTE — ED Notes (Signed)
Patient from [facility].  Reports nausea with 3 episodes of vomiting today.  States this began at 3 am.  Denies diarrhea.

## 2015-08-22 NOTE — ED Provider Notes (Addendum)
CSN: [id number]     Arrival date & time 08/22/15  1118 History   First MD Initiated Contact with Patient 08/22/15 1137     Chief Complaint  Patient presents with  . Nausea  . Emesis     (Consider location/radiation/quality/duration/timing/severity/associated sxs/prior Treatment) HPI  Patient is at [facility] assisted [facility]. Reportedly started vomiting at approximately 3 AM. Patient reports she had multiple episodes of vomiting and she denies diarrhea. A is denying abdominal pain at this time. She does not know she has had a fever, she does not think so. Report from nursing home is that the emesis was yellow in appearance. Patient's daughter had called in and reported that patient had a similar presentation when she had an ileus. The patient is denying chest pain or shortness of breath. Does describe ongoing problems with leg pain. She does not describe this is a new pain and she is not localizing it. Past Medical History  Diagnosis Date  . CHF (congestive heart failure) ([place])   . Persistent atrial fibrillation ([place])   . Bone spur   . Arthritis   . Coronary artery disease     a. s/p CABG x2  . Hypertension   . OSA on CPAP   . Impaired memory   . Depression with anxiety   . Gait disturbance   . Osteoporosis   . GI bleed   . Carotid artery disease ([place])     a. s/p L CEA  . Mitral valvular regurgitation     a. moderate to severe  . PAD (peripheral artery disease) ([facility])     a. s/p PTA of left internal iliac  . Cancer [facility])     carcinoma left leg   Past Surgical History  Procedure Laterality Date  . Bypass graft    . Back surgery    . Appendectomy    . Coronary angioplasty    . Cholecystectomy    . Tonsillectomy    . Abdominal hysterectomy      partial  . Eye surgery      bilat cataract, lasix surgery  . Breast lumpectomy Left     1980's  . Esophagogastroduodenoscopy (egd) with propofol N/A 07/06/2015    Procedure: ESOPHAGOGASTRODUODENOSCOPY (EGD) WITH PROPOFOL;   Surgeon: Mauri Pole, MD;  Location: [facility] END[facility];  Service: Endoscopy;  Laterality: N/A;  . Colonoscopy with propofol N/A 07/09/2015    Procedure: COLONOSCOPY WITH PROPOFOL;  Surgeon: Doran Stabler, MD;  Location: [facility] ENDOSCOPY;  Service: Endoscopy;  Laterality: N/A;   Family History  Problem Relation Age of Onset  . Lung cancer Mother   . Heart disease Father   . Throat cancer Brother    Social History  Substance Use Topics  . Smoking status: Current Some Day Smoker -- 0.20 packs/day  . Smokeless tobacco: Never Used  . Alcohol Use: No   OB History    No data available     Review of Systems 10 Systems reviewed and are negative for acute change except as noted in the HPI.    Allergies  Review of patient's allergies indicates no known allergies.  Home Medications   Prior to Admission medications   Medication Sig Start Date End Date Taking? Authorizing Provider  atorvastatin (LIPITOR) 80 MG tablet Take 80 mg by mouth daily.   Yes Historical Provider, MD  busPIRone (BUSPAR) 7.5 MG tablet Take 7.5 mg by mouth 2 (two) times daily.   Yes Historical Provider, MD  feeding supplement, ENSURE ENLIVE, (ENSURE  ENLIVE) LIQD Take 237 mLs by mouth 2 (two) times daily between meals. 07/09/15  Yes Modena Jansky, MD  ferrous sulfate 325 (65 FE) MG tablet Take 1 tablet (325 mg total) by mouth 2 (two) times daily with a meal. 07/09/15  Yes Modena Jansky, MD  gabapentin (NEURONTIN) 300 MG capsule Take 300 mg by mouth 2 (two) times daily.    Yes Historical Provider, MD  midodrine (PROAMATINE) 5 MG tablet Take 5 mg by mouth 3 (three) times daily with meals.   Yes Historical Provider, MD  oxyCODONE-acetaminophen (PERCOCET) 10-325 MG tablet Take 1 tablet by mouth 3 (three) times daily.   Yes Historical Provider, MD  potassium chloride (K-DUR,KLOR-CON) 10 MEQ tablet Take 10 mEq by mouth 2 (two) times daily.   Yes Historical Provider, MD  Rivaroxaban (XARELTO) 15 MG TABS tablet Take 15 mg  by mouth daily.    Yes Historical Provider, MD  spironolactone (ALDACTONE) 25 MG tablet Take 12.5 mg by mouth daily. Take one-half tablet (12.5mg )  daily   Yes Historical Provider, MD  traMADol (ULTRAM) 50 MG tablet Take 50 mg by mouth every 6 (six) hours as needed. Take 1-2 tablets (50-100mg ) every 4 hours as needed for pain   Yes Historical Provider, MD  traZODone (DESYREL) 50 MG tablet Take 50 mg by mouth at bedtime.   Yes Historical Provider, MD  acetaminophen (TYLENOL) 500 MG tablet Take 500 mg by mouth every 6 (six) hours as needed (body pain).    Historical Provider, MD  albuterol (PROVENTIL) (2.5 MG/3ML) 0.083% nebulizer solution Take 2.5 mg by nebulization every 6 (six) hours as needed for wheezing or shortness of breath.    Historical Provider, MD  calcium carbonate (TUMS EX) 750 MG chewable tablet Chew 1 tablet by mouth 3 (three) times daily as needed for heartburn.    Historical Provider, MD  Melatonin 5 MG CAPS Take 1 capsule by mouth at bedtime as needed (sleep).    Historical Provider, MD  nitroGLYCERIN (NITROSTAT) 0.4 MG SL tablet Place 1 tablet (0.4 mg total) under the tongue every 5 (five) minutes as needed for chest pain. Up to 3 doses 07/23/15   Imogene Burn, PA-C  polyethylene glycol [facility] / GLYCOLAX) packet Take 17 g by mouth daily as needed (constipation).     Historical Provider, MD  senna (SENOKOT) 8.6 MG tablet Take 1 tablet by mouth daily as needed for constipation.    Historical Provider, MD   BP 73/51 mmHg  Pulse 76  Temp(Src) 101.6 F (38.7 C) (Oral)  Resp 23  Ht 5\' 3"  (1.6 m)  Wt 103 lb (46.72 kg)  BMI 18.25 kg/m2  SpO2 95% Physical Exam  Constitutional:  Patient is thin and mildly pale appearance. She appears ill. She however is not toxic and there is no respiratory distress.  HENT:  Head: Normocephalic and atraumatic.  Right Ear: External ear normal.  Left Ear: External ear normal.  Nose: Nose normal.  Mucous membranes are slightly dry. Posterior  oropharynx  widely patent.  Eyes: EOM are normal. Pupils are equal, round, and reactive to light.  Neck: Neck supple.  Cardiovascular:  Borderline tachycardia. 2/6 systolic ejection murmur. Regular.  Pulmonary/Chest: Effort normal. No respiratory distress. She has no wheezes. She has no rales.  Breath sounds seem slightly diminished on the right. No gross wheeze rhonchi rail.  Abdominal: Soft. Bowel sounds are normal. She exhibits no distension and no mass. There is tenderness. There is no rebound and no guarding.  Patient is endorses suprapubic tenderness over the bladder. No guarding.  Musculoskeletal: She exhibits no edema or tenderness.  Examination of the patient's feet and lower legs do not show any cellulitis or active wounds. She does not have significant peripheral edema. Trace pitting is present. Dorsalis pedis pulses are 2+ and symmetric. Calves are soft and nontender.  Neurological: She is alert. No cranial nerve deficit. She exhibits normal muscle tone. Coordination normal.  Patient seems mildly confused. She is answering questions appropriately but seems forgetful and at times information appears to be incorrect. She then gets back on track and seems oriented. She is following commands appropriately. No focal neurologic deficit.  Skin: Skin is warm and dry. There is pallor.  Psychiatric: She has a normal mood and affect.    ED Course  Procedures (including critical care time) CRITICAL CARE Performed by: Charlesetta Shanks   Total critical care time: 60 minutes  Critical care time was exclusive of separately billable procedures and treating other patients.  Critical care was necessary to treat or prevent imminent or life-threatening deterioration.  Critical care was time spent personally by me on the following activities: development of treatment plan with patient and/or surrogate as well as nursing, discussions with consultants, evaluation of patient's response to treatment,  examination of patient, obtaining history from patient or surrogate, ordering and performing treatments and interventions, ordering and review of laboratory studies, ordering and review of radiographic studies, pulse oximetry and re-evaluation of patient's condition. Angiocath insertion Performed by: Charlesetta Shanks  Consent: Verbal consent obtained. Risks and benefits: risks, benefits and alternatives were discussed Time out: Immediately prior to procedure a "time out" was called to verify the correct patient, procedure, equipment, support staff and site/side marked as required.  Preparation: Patient was prepped and draped in the usual sterile fashion.  Vein Location:right basilic Ultrasound Guided  Gauge: 20 Normal blood return and flush without difficulty Patient tolerance: Patient tolerated the procedure well with no immediate complications.  Labs Review Labs Reviewed  COMPREHENSIVE METABOLIC PANEL - Abnormal; Notable for the following:    Glucose, Bld 109 (*)    Creatinine, Ser 1.12 (*)    Albumin 3.2 (*)    AST 49 (*)    GFR calc non Af Amer 49 (*)    GFR calc Af Amer 56 (*)    All other components within normal limits  TROPONIN I - Abnormal; Notable for the following:    Troponin I 0.10 (*)    All other components within normal limits  CBC WITH DIFFERENTIAL/PLATELET - Abnormal; Notable for the following:    WBC 22.7 (*)    RDW 23.4 (*)    Platelets 94 (*)    Neutro Abs 20.9 (*)    Monocytes Absolute 1.1 (*)    All other components within normal limits  PROTIME-INR - Abnormal; Notable for the following:    Prothrombin Time 17.8 (*)    All other components within normal limits  URINALYSIS, ROUTINE W REFLEX MICROSCOPIC (NOT AT [facility]) - Abnormal; Notable for the following:    APPearance TURBID (*)    Hgb urine dipstick SMALL (*)    Nitrite POSITIVE (*)    Leukocytes, UA LARGE (*)    All other components within normal limits  URINE MICROSCOPIC-ADD ON - Abnormal; Notable  for the following:    Squamous Epithelial / LPF 0-5 (*)    Bacteria, UA MANY (*)    All other components within normal limits  I-STAT CG4 LACTIC ACID, ED -  Abnormal; Notable for the following:    Lactic Acid, Venous 2.28 (*)    All other components within normal limits  CULTURE, BLOOD (ROUTINE X 2)  CULTURE, BLOOD (ROUTINE X 2)  URINE CULTURE  LIPASE, BLOOD  I-STAT CG4 LACTIC ACID, ED  I-STAT CG4 LACTIC ACID, ED  TYPE AND SCREEN  ABO/RH    Imaging Review Ct Abdomen Pelvis W Contrast  08/22/2015  CLINICAL DATA:  Intractable nausea and vomiting. Leukocytosis. Fever. EXAM: CT ABDOMEN AND PELVIS WITH CONTRAST TECHNIQUE: Multidetector CT imaging of the abdomen and pelvis was performed using the standard protocol following bolus administration of intravenous contrast. CONTRAST:  36mL ISOVUE-300 IOPAMIDOL (ISOVUE-300) INJECTION 61% COMPARISON:  None. FINDINGS: Lower chest: There is prominent patchy consolidation in the dependent basilar right lower lobe. Mild patchy ground-glass opacity in the peripheral left lower lobe. Visualized sternotomy wire is intact. Myocardial calcifications at the left ventricular apex from prior myocardial infarction. Coronary atherosclerosis. Hepatobiliary: Normal liver with no liver mass. Cholecystectomy. Bile ducts are within expected post cholecystectomy limits with common bile duct diameter 5 mm. Pancreas: Atrophic appearing pancreas with no pancreatic mass or pancreatic duct dilation. Spleen: Normal size. No mass. Adrenals/Urinary Tract: Normal adrenals. No hydronephrosis. No renal mass. Normal bladder. There is crescentic high density material surrounding the urethra, possibly representing post treatment changes, correlate with surgical history. Stomach/Bowel: Grossly normal stomach. Normal caliber small bowel with no small bowel wall thickening. Status post appendectomy . Oral contrast progresses to the rectum. Moderate stool throughout the colon and rectum. There is a  suggestion of mild wall thickening in the cecum, ascending colon and hepatic flexure of the colon with associated mild pericolonic fat stranding. Vascular/Lymphatic: Markedly atherosclerotic nonaneurysmal abdominal aorta. Patent portal, splenic, hepatic and renal veins. Large left splenorenal shunt. No pathologically enlarged lymph nodes in the abdomen or pelvis. Reproductive: Status post hysterectomy, with no abnormal findings at the vaginal cuff. No adnexal mass. Other: No pneumoperitoneum, ascites or focal fluid collection. Musculoskeletal: No aggressive appearing focal osseous lesions. Patient is status post bilateral posterior L5-S1 fusion, with no evidence of hardware fracture or loosening. Moderate degenerative changes in the visualized thoracolumbar spine. Left back subcutaneous spinal stimulator demonstrates 2 leads coursing into the thoracic spinal canal, with the tips not seen on this study. IMPRESSION: 1. Patchy basilar right lower lobe consolidation and mild patchy left lower lobe ground-glass opacities, favor multifocal pneumonia and/or aspiration. 2. Mild colonic wall thickening and pericolonic fat stranding in the proximal colon, suggesting a nonspecific mild infectious or inflammatory colitis, with the differential including C. diff colitis. No evidence of bowel obstruction. 3. Large left splenorenal shunt of uncertain etiology. Portal, hepatic, splenic and renal veins appear patent. No macroscopic evidence of cirrhosis. No ascites. 4. Coronary atherosclerosis and remote myocardial infarct at the left ventricular apex. 5. Crescentic high density material surrounding the urethra, correlate with surgical history. Electronically Signed   By: Ilona Sorrel M.D.   On: 08/22/2015 16:25   I have personally reviewed and evaluated these images and lab results as part of my medical decision-making.   EKG Interpretation   Date/Time:  Wednesday August 22 2015 13:23:18 EDT Ventricular Rate:  84 PR  Interval:  154 QRS Duration: 111 QT Interval:  497 QTC Calculation: 588 R Axis:   113 Text Interpretation:  Unknown rhythm, irregular rate Inferolateral  infarct, age indeterminate Abnrm T, consider ischemia, anterolateral lds  Prolonged QT interval agree. increased inferior Q wave. ischemic  appearance. Confirmed by Johnney Killian, MD, Jeannie Done [phone]) on  08/22/2015 1:38:26  PM     Consult: Patient's case reviewed with intensivist. He advises for admission to hospitalist stepdown. Consult: Case reviewed with hospitalist. Will admit for sepsis with UTI. CT pending. MDM   Final diagnoses:  Sepsis, due to unspecified organism [facility])  UTI (lower urinary tract infection)  Non-intractable vomiting with nausea, vomiting of unspecified type  Urinary retention  Constipation, unspecified constipation type   Patient presents with acute onset of active vomiting. She presented with signs of sepsis with fever and hypotension. Lactic acid elevated. Sepsis protocol initiated. CT scan returned does not show immediate surgical etiology. There are some diffuse findings of colitis. Bladder is significantly distended and full on CT with some high density material around the urethra. I will have fully catheter placed to relieve  with any urinary retention. Patient also has moderate amount of stool throughout the colon without evidence obstruction on CT. She may have urinary retention secondary to constipation. Patient remains alert without respiratory distress.    Charlesetta Shanks, MD 08/22/15 1640  Charlesetta Shanks, MD 08/22/15 [phone]

## 2015-08-22 NOTE — Progress Notes (Signed)
Pharmacy Antibiotic Note  Nicole Randall is a 70 y.o. female admitted on 08/22/2015 with nausea and emesis, sepsis and UTI.  Pharmacy was initially consulted for Zosyn dosing, now changing to Cefepime on admission.  Plan:  DC Zosyn  Cefepime 1g IV q24h  Follow up renal fxn, culture results, and clinical course.  Height: 5\' 3"  (160 cm) Weight: 103 lb (46.72 kg) IBW/kg (Calculated) : 52.4  Temp (24hrs), Avg:100.7 F (38.2 C), Min:99.5 F (37.5 C), Max:101.6 F (38.7 C)   Recent Labs Lab 08/22/15 1310 08/22/15 1331 08/22/15 1656  WBC 22.7*  --   --   CREATININE 1.12*  --   --   LATICACIDVEN  --  2.28* 1.23    Estimated Creatinine Clearance: 34.5 mL/min (by C-G formula based on Cr of 1.12).   No Known Allergies  Antimicrobials this admission: 3/29 >> Zosyn >> 3/29 3/29 >> Cefepime >>  Dose adjustments this admission:  Microbiology results: 3/29 BCx: sent 3/29 UCx: sent  3/29 MRSA PCR: sent  Thank you for allowing pharmacy to be a part of this patient's care.  Peggyann Juba, PharmD, BCPS Pager: [phone]  08/22/2015 7:47 PM

## 2015-08-22 NOTE — Progress Notes (Signed)
PHARMACIST - PHYSICIAN ORDER COMMUNICATION  CONCERNING: P&T Medication Policy on Herbal Medications  DESCRIPTION:  This patient's order for:  melatonin  has been noted.  This product(s) is classified as an "herbal" or natural product. Due to a lack of definitive safety studies or FDA approval, nonstandard manufacturing practices, plus the potential risk of unknown drug-drug interactions while on inpatient medications, the Pharmacy and Therapeutics Committee does not permit the use of "herbal" or natural products of this type within [facility].   ACTION TAKEN: The pharmacy department is unable to verify this order at this time and your patient has been informed of this safety policy. Please reevaluate patient's clinical condition at discharge and address if the herbal or natural product(s) should be resumed at that time.  Doreene Eland, PharmD, BCPS.   Pager: [phone] 08/22/2015 8:00 PM

## 2015-08-23 ENCOUNTER — Inpatient Hospital Stay (HOSPITAL_COMMUNITY): Payer: MEDICARE

## 2015-08-23 DIAGNOSIS — R1110 Vomiting, unspecified: Secondary | ICD-10-CM | POA: Insufficient documentation

## 2015-08-23 DIAGNOSIS — R112 Nausea with vomiting, unspecified: Secondary | ICD-10-CM

## 2015-08-23 LAB — CBC
HCT: 30.1 % — ABNORMAL LOW (ref 36.0–46.0)
Hemoglobin: 9.8 g/dL — ABNORMAL LOW (ref 12.0–15.0)
MCH: 29.6 pg (ref 26.0–34.0)
MCHC: 32.6 g/dL (ref 30.0–36.0)
MCV: 90.9 fL (ref 78.0–100.0)
PLATELETS: 75 10*3/uL — AB (ref 150–400)
RBC: 3.31 MIL/uL — ABNORMAL LOW (ref 3.87–5.11)
RDW: 23.9 % — AB (ref 11.5–15.5)
WBC: 19.7 10*3/uL — ABNORMAL HIGH (ref 4.0–10.5)

## 2015-08-23 LAB — COMPREHENSIVE METABOLIC PANEL
ALK PHOS: 66 U/L (ref 38–126)
ALT: 28 U/L (ref 14–54)
ANION GAP: 6 (ref 5–15)
AST: 62 U/L — ABNORMAL HIGH (ref 15–41)
Albumin: 2.2 g/dL — ABNORMAL LOW (ref 3.5–5.0)
BUN: 18 mg/dL (ref 6–20)
CHLORIDE: 116 mmol/L — AB (ref 101–111)
CO2: 21 mmol/L — AB (ref 22–32)
Calcium: 7.7 mg/dL — ABNORMAL LOW (ref 8.9–10.3)
Creatinine, Ser: 0.92 mg/dL (ref 0.44–1.00)
GFR calc non Af Amer: >60 mL/min (ref >60)
Glucose, Bld: 109 mg/dL — ABNORMAL HIGH (ref 65–99)
Potassium: 3.6 mmol/L (ref 3.5–5.1)
SODIUM: 143 mmol/L (ref 135–145)
TOTAL PROTEIN: 5.4 g/dL — AB (ref 6.5–8.1)
Total Bilirubin: 0.8 mg/dL (ref 0.3–1.2)

## 2015-08-23 LAB — VITAMIN B12: Vitamin B-12: 963 pg/mL — ABNORMAL HIGH (ref 180–914)

## 2015-08-23 LAB — LACTIC ACID, PLASMA: LACTIC ACID, VENOUS: 2.6 mmol/L — AB (ref 0.5–2.0)

## 2015-08-23 MED ORDER — SODIUM CHLORIDE 0.9 % IV BOLUS (SEPSIS)
500.0000 mL | Freq: Once | INTRAVENOUS | Status: AC
  Administered 2015-08-23: 500 mL via INTRAVENOUS

## 2015-08-23 MED ORDER — POTASSIUM CHLORIDE IN NACL 20-0.9 MEQ/L-% IV SOLN
INTRAVENOUS | Status: DC
  Administered 2015-08-23 – 2015-08-24 (×2): via INTRAVENOUS
  Filled 2015-08-23 (×3): qty 1000

## 2015-08-23 MED ORDER — AMIODARONE HCL 200 MG PO TABS
200.0000 mg | ORAL_TABLET | Freq: Every day | ORAL | Status: DC
  Administered 2015-08-23 – 2015-08-25 (×3): 200 mg via ORAL
  Filled 2015-08-23 (×4): qty 1

## 2015-08-23 MED ORDER — SODIUM CHLORIDE 0.9 % IV SOLN: INTRAVENOUS | Status: DC

## 2015-08-23 NOTE — Progress Notes (Signed)
Initial Nutrition Assessment  DOCUMENTATION CODES:   Severe malnutrition in context of acute illness/injury, Underweight  INTERVENTION:   - Continue providing Ensure Enlive po BID, each supplement provides 350 kcals and 20 grams of protein. - Will continue to monitor for nutritional needs.  NUTRITION DIAGNOSIS:   Inadequate oral intake related to poor appetite as evidenced by per patient/family report.  GOAL:   Patient will meet greater than or equal to 90% of their needs  MONITOR:   PO intake, Supplement acceptance, Diet advancement, Labs, Weight trends  REASON FOR ASSESSMENT:   Malnutrition Screening Tool    ASSESSMENT:   70 year old female with a history of persistent Afib, systolic CHF, HTN, CAD, peripheral vascular disease, HLD, anxiety presented with intractable nausea and vomiting on the morning of admission. There was no hematemesis or coffee grounds material. The patient has subjective fevers and chills but denied any chest pain,SOB, coughing, hemoptysis, diarrhea, hematochezia, melena. Notably, the patient was recently discharged from the hospital after a stay between 07/04/2015 through 07/09/2015 for upper GI bleed And acute blood loss anemia. Colonoscopy on 07/09/2015 revealed diverticulosis and EGD on 07/06/2015 suggested gastroparesis.   Patient reports that she has had a decreased appetite for about 6 months now, stating that she is unsure why she has not been hungry.  Patient says during this time she would have 1-2 small meals per day, often salads.  Patient states that she is currently slightly hungry, but only for foods she wants including salad and spaghetti.  States that she has been trying to eat something throughout the day during her admission.  She tolerated her CLD breakfast tray, 90% meal completion. Diet was advanced to FLD for lunch, and she was finishing her soup, 1/2 cup jello, and Ensure.     Nutrition Focused Physical Exam was completed.  Findings  include moderate fat depletion, moderate muscle depletion, and no edema.  Patient states that her legs do often swell, but today they were not fluid filled.  Patient reports that she has lost a some weight over the past 6 months, reporting that she has been weighing around 105# most recently.  Per chart review, patient has lost 13# / 11% body weight x 2 months, significant for the time frame.  Patient classified as underweight with a BMI of 18.5 kg/(m^2).    Patient not likely meeting needs and is amenable to continuing with Ensure BID.  Will continue to monitor for nutrition needs.  Medications reviewed: ferrous sulfate.  Labs reviewed.   Diet Order:  Diet full liquid Room service appropriate?: Yes; Fluid consistency:: Thin  Skin:  Reviewed, no issues  Last BM:  3/30  Height:   Ht Readings from Last 1 Encounters:  08/22/15 5\' 3"  (1.6 m)    Weight:   Wt Readings from Last 1 Encounters:  08/22/15 103 lb (46.72 kg)    Ideal Body Weight:  52.3 kg  BMI:  Body mass index is 18.25 kg/(m^2).  Estimated Nutritional Needs:   Kcal:  1300-1500  Protein:  55-65 grams  Fluid:  >/= 1.5 L  EDUCATION NEEDS:   No education needs identified at this time  Veronda Prude, Dietetic Intern Pager: [phone]

## 2015-08-23 NOTE — Progress Notes (Signed)
PROGRESS NOTE  Nicole Randall [id number] DOB: [birth year] DOA: 08/22/2015 PCP: Gildardo Cranker, DO Brief History 70 year old female with a history of persistent atrial fibrillation on Xarelto, systolic CHF, hypertension, coronary artery disease, peripheral vascular disease, hyperlipidemia, anxiety presented with intractable nausea and vomiting on the morning of admission. The patient woke up having numerous episodes of nausea and vomiting approximately 4:30 AM on 08/22/2015. It resolved for short period of time, but around 7:30 AM, the patient began having 3-4 episodes of emesis again. There was no hematemesis or coffee grounds material.  Notably, the patient was recently discharged from the hospital after a stay between 07/04/2015 through 07/09/2015 for upper GI bleed And acute blood loss anemia. Colonoscopy on 07/09/2015 revealed diverticulosis and EGD on 07/06/2015 suggested gastroparesis. The patient's rivaroxaban was restarted and aspirin was discontinued.  In the emergency department, the patient was noted to have WBC 22.7, platelets 94,000 with fever 101.8F. The patienthad soft blood pressures with systolic blood pressures in the 90s. The patient was given 30 mL/kg IV fluid bolus and started on Zosyn. Lactic acid was 2.88. Serum creatinine was 1.12. Urinalysis showed TNTC WBC  Assessment/Plan: Sepsis -Secondary to genitourinary source -Continue cefepime pending culture data -Blood cultures 2 sets--neg <24 hours -Urine culture--pending -Chest x-ray--bibasilar atelectasis -Lactic acid 2.88 -Procalcitonin--12.72 -Continue IV fluids  Pyuria/suprapubic pain -Continue cefepime pending culture data  Intractable nausea and vomiting -CT abdomen and pelvis without contrast--monitor stool with mild wall thickening in the cecum and ascending colon-->  Suspect this may be artifact as CT was without contrast and patient had colonoscopy approximately one month ago significant only  for diverticulosis -Lipase 19 -resolved -Advance diet  Abnormal chest x-ray -There was concern for possible pneumothorax -Repeat chest x-ray expiratory  Paroxysmal atrial fibrillation -Continue xarelto -Patient was not deemed to be a good candidate for Watchman device due to her comorbidities -CHADS-VASc = 5 -rate controlled  Chronic systolic CHF -[id number] echo EF 35-40 percent -Clinically compensated at this time -hold Aldactone at this time secondary to soft BP  Severe malnutrition -Continue ensure  Thrombocytopenia -Suspect this is chronic -The patient had thrombocytopenia during her last admission -Check fibrinogen--378 -B12--963  Elevated troponin/CAD s/p CABG x 2  -No chest pain presently -Likely demand ischemia in the setting of sepsis -Continue Lipitor  Tobacco abuse -Still smoking. 4 cigarettes per day -Patient has nearly 100-pack-year history -Tobacco cessation discussed    Family Communication:   Pt at beside Disposition Plan:   Transfer to tele;  Probably 4/1 or 4/2 home       Procedures/Studies: Ct Abdomen Pelvis W Contrast  08/22/2015  CLINICAL DATA:  Intractable nausea and vomiting. Leukocytosis. Fever. EXAM: CT ABDOMEN AND PELVIS WITH CONTRAST TECHNIQUE: Multidetector CT imaging of the abdomen and pelvis was performed using the standard protocol following bolus administration of intravenous contrast. CONTRAST:  6mL ISOVUE-300 IOPAMIDOL (ISOVUE-300) INJECTION 61% COMPARISON:  None. FINDINGS: Lower chest: There is prominent patchy consolidation in the dependent basilar right lower lobe. Mild patchy ground-glass opacity in the peripheral left lower lobe. Visualized sternotomy wire is intact. Myocardial calcifications at the left ventricular apex from prior myocardial infarction. Coronary atherosclerosis. Hepatobiliary: Normal liver with no liver mass. Cholecystectomy. Bile ducts are within expected post cholecystectomy limits with common bile duct  diameter 5 mm. Pancreas: Atrophic appearing pancreas with no pancreatic mass or pancreatic duct dilation. Spleen: Normal size. No mass. Adrenals/Urinary Tract: Normal adrenals. No hydronephrosis. No renal mass. Normal bladder. There is  crescentic high density material surrounding the urethra, possibly representing post treatment changes, correlate with surgical history. Stomach/Bowel: Grossly normal stomach. Normal caliber small bowel with no small bowel wall thickening. Status post appendectomy . Oral contrast progresses to the rectum. Moderate stool throughout the colon and rectum. There is a suggestion of mild wall thickening in the cecum, ascending colon and hepatic flexure of the colon with associated mild pericolonic fat stranding. Vascular/Lymphatic: Markedly atherosclerotic nonaneurysmal abdominal aorta. Patent portal, splenic, hepatic and renal veins. Large left splenorenal shunt. No pathologically enlarged lymph nodes in the abdomen or pelvis. Reproductive: Status post hysterectomy, with no abnormal findings at the vaginal cuff. No adnexal mass. Other: No pneumoperitoneum, ascites or focal fluid collection. Musculoskeletal: No aggressive appearing focal osseous lesions. Patient is status post bilateral posterior L5-S1 fusion, with no evidence of hardware fracture or loosening. Moderate degenerative changes in the visualized thoracolumbar spine. Left back subcutaneous spinal stimulator demonstrates 2 leads coursing into the thoracic spinal canal, with the tips not seen on this study. IMPRESSION: 1. Patchy basilar right lower lobe consolidation and mild patchy left lower lobe ground-glass opacities, favor multifocal pneumonia and/or aspiration. 2. Mild colonic wall thickening and pericolonic fat stranding in the proximal colon, suggesting a nonspecific mild infectious or inflammatory colitis, with the differential including C. diff colitis. No evidence of bowel obstruction. 3. Large left splenorenal shunt of  uncertain etiology. Portal, hepatic, splenic and renal veins appear patent. No macroscopic evidence of cirrhosis. No ascites. 4. Coronary atherosclerosis and remote myocardial infarct at the left ventricular apex. 5. Crescentic high density material surrounding the urethra, correlate with surgical history. Electronically Signed   By: Ilona Sorrel M.D.   On: 08/22/2015 16:25   Dg Chest Port 1 View  08/22/2015  CLINICAL DATA:  70 year old female with sepsis. EXAM: PORTABLE CHEST 1 VIEW COMPARISON:  None. FINDINGS: Single-view of the chest demonstrates bibasilar hazy densities which may be atelectatic changes or represent pneumonia. There is no focal consolidation or pleural effusion. Linear density along the lateral aspect of the right lung most likely or skin fold. A small pneumothorax is much less likely. Repeat radiograph recommended if there is clinical concern for pneumothorax. There is mild cardiomegaly. Median sternotomy wires and CABG vascular clips noted. Spinal stimulator wires noted. No acute fracture. IMPRESSION: Bibasilar atelectasis/infiltrate. Skin fold artifact versus less likely a small right pneumothorax. Repeat radiograph is recommended if there is high clinical concern for pneumothorax. Electronically Signed   By: Anner Crete M.D.   On: 08/22/2015 20:32         Subjective: Patient denies fevers, chills, headache, chest pain, dyspnea, nausea, vomiting, diarrhea, abdominal pain, dysuria, hematuria   Objective: Filed Vitals:   08/23/15 0518 08/23/15 0600 08/23/15 0700 08/23/15 0800  BP: 134/81 122/38 114/46   Pulse: 75 71 72   Temp:    98.4 F (36.9 C)  TempSrc:    Oral  Resp: 22 19 20    Height:      Weight:      SpO2: 95% 96% 95%     Intake/Output Summary (Last 24 hours) at 08/23/15 1056 Last data filed at 08/23/15 0700  Gross per 24 hour  Intake   1685 ml  Output   1720 ml  Net    -35 ml   Weight change:  Exam:   General:  Pt is alert, follows commands  appropriately, not in acute distress  HEENT: No icterus, No thrush, No neck mass, [place]/AT  Cardiovascular: RRR, S1/S2, no rubs, no  gallops  Respiratory: lungs basilar crackles. No wheezing. Good air movement.  Abdomen: Soft/+BS, non tender, non distended, no guarding  Extremities: No edema, No lymphangitis, No petechiae, No rashes, no synovitis  Data Reviewed: Basic Metabolic Panel:  Recent Labs Lab 08/22/15 1310 08/23/15 0554  NA 140 143  K 4.3 3.6  CL 104 116*  CO2 26 21*  GLUCOSE 109* 109*  BUN 19 18  CREATININE 1.12* 0.92  CALCIUM 9.5 7.7*   Liver Function Tests:  Recent Labs Lab 08/22/15 1310 08/22/15 1946 08/23/15 0554  AST 49* 67* 62*  ALT 26 32 28  ALKPHOS 93 79 66  BILITOT 1.1 0.8 0.8  PROT 7.5 5.9* 5.4*  ALBUMIN 3.2* 2.4* 2.2*    Recent Labs Lab 08/22/15 1310  LIPASE 19   No results for input(s): AMMONIA in the last 168 hours. CBC:  Recent Labs Lab 08/22/15 1310 08/23/15 0554  WBC 22.7* 19.7*  NEUTROABS 20.9*  --   HGB 12.4 9.8*  HCT 37.3 30.1*  MCV 89.7 90.9  PLT 94* 75*   Cardiac Enzymes:  Recent Labs Lab 08/22/15 1310  TROPONINI 0.10*   BNP: Invalid input(s): POCBNP CBG: No results for input(s): GLUCAP in the last 168 hours.  Recent Results (from the past 240 hour(s))  MRSA PCR Screening     Status: None   Collection Time: 08/22/15  7:35 PM  Result Value Ref Range Status   MRSA by PCR NEGATIVE NEGATIVE Final    Comment:        The GeneXpert MRSA Assay (FDA approved for NASAL specimens only), is one component of a comprehensive MRSA colonization surveillance program. It is not intended to diagnose MRSA infection nor to guide or monitor treatment for MRSA infections.      Scheduled Meds: . atorvastatin  80 mg Oral q1800  . busPIRone  7.5 mg Oral BID  . ceFEPime (MAXIPIME) IV  1 g Intravenous Q24H  . feeding supplement (ENSURE ENLIVE)  237 mL Oral BID BM  . ferrous sulfate  325 mg Oral BID WC  . gabapentin  300  mg Oral BID  . Glycerin (Adult)  1 suppository Rectal Once  . midodrine  5 mg Oral TID WC  . oxyCODONE-acetaminophen  1 tablet Oral TID   And  . oxyCODONE  5 mg Oral TID  . Rivaroxaban  15 mg Oral Q supper  . sodium chloride flush  3 mL Intravenous Q12H  . traZODone  50 mg Oral QHS   Continuous Infusions: . sodium chloride 1,000 mL (08/23/15 0540)     Normajean Nash, DO  [facility] Pager [phone]  If 7PM-7AM, please contact night-coverage [email or web address] Password [email or web address] 08/23/2015, 10:56 AM   LOS: 1 day

## 2015-08-23 NOTE — Progress Notes (Signed)
Notified on call provider with [facility] of low blood pressures. New orders written.

## 2015-08-24 ENCOUNTER — Encounter (HOSPITAL_COMMUNITY): Payer: Self-pay | Admitting: Physician Assistant

## 2015-08-24 ENCOUNTER — Inpatient Hospital Stay (HOSPITAL_COMMUNITY): Payer: MEDICARE

## 2015-08-24 DIAGNOSIS — I5023 Acute on chronic systolic (congestive) heart failure: Secondary | ICD-10-CM

## 2015-08-24 DIAGNOSIS — K625 Hemorrhage of anus and rectum: Secondary | ICD-10-CM | POA: Insufficient documentation

## 2015-08-24 DIAGNOSIS — I248 Other forms of acute ischemic heart disease: Secondary | ICD-10-CM

## 2015-08-24 DIAGNOSIS — I480 Paroxysmal atrial fibrillation: Secondary | ICD-10-CM

## 2015-08-24 DIAGNOSIS — K5900 Constipation, unspecified: Secondary | ICD-10-CM

## 2015-08-24 LAB — BASIC METABOLIC PANEL
Anion gap: 4 — ABNORMAL LOW (ref 5–15)
BUN: 13 mg/dL (ref 6–20)
CALCIUM: 7.8 mg/dL — AB (ref 8.9–10.3)
CO2: 20 mmol/L — ABNORMAL LOW (ref 22–32)
CREATININE: 0.74 mg/dL (ref 0.44–1.00)
Chloride: 120 mmol/L — ABNORMAL HIGH (ref 101–111)
GFR calc Af Amer: >60 mL/min (ref >60)
GLUCOSE: 91 mg/dL (ref 65–99)
Potassium: 3.5 mmol/L (ref 3.5–5.1)
Sodium: 144 mmol/L (ref 135–145)

## 2015-08-24 LAB — CBC
HCT: 28.2 % — ABNORMAL LOW (ref 36.0–46.0)
Hemoglobin: 9 g/dL — ABNORMAL LOW (ref 12.0–15.0)
MCH: 29 pg (ref 26.0–34.0)
MCHC: 31.9 g/dL (ref 30.0–36.0)
MCV: 91 fL (ref 78.0–100.0)
Platelets: 71 10*3/uL — ABNORMAL LOW (ref 150–400)
RBC: 3.1 MIL/uL — ABNORMAL LOW (ref 3.87–5.11)
RDW: 23.7 % — AB (ref 11.5–15.5)
WBC: 11.9 10*3/uL — ABNORMAL HIGH (ref 4.0–10.5)

## 2015-08-24 LAB — MAGNESIUM: MAGNESIUM: 1.3 mg/dL — AB (ref 1.7–2.4)

## 2015-08-24 LAB — URINE CULTURE

## 2015-08-24 MED ORDER — POTASSIUM CHLORIDE CRYS ER 20 MEQ PO TBCR
40.0000 meq | EXTENDED_RELEASE_TABLET | Freq: Once | ORAL | Status: AC
  Administered 2015-08-24: 40 meq via ORAL
  Filled 2015-08-24: qty 2

## 2015-08-24 MED ORDER — POLYETHYLENE GLYCOL 3350 17 G PO PACK
17.0000 g | PACK | Freq: Every day | ORAL | Status: DC
  Administered 2015-08-25 – 2015-08-28 (×4): 17 g via ORAL
  Filled 2015-08-24 (×5): qty 1

## 2015-08-24 MED ORDER — AMPICILLIN SODIUM 2 G IJ SOLR
2.0000 g | Freq: Four times a day (QID) | INTRAMUSCULAR | Status: AC
Start: 2015-08-24 — End: 2015-08-25
  Administered 2015-08-24 – 2015-08-25 (×6): 2 g via INTRAVENOUS
  Filled 2015-08-24 (×6): qty 2000

## 2015-08-24 MED ORDER — MAGNESIUM SULFATE 2 GM/50ML IV SOLN
2.0000 g | Freq: Once | INTRAVENOUS | Status: AC
  Administered 2015-08-24: 2 g via INTRAVENOUS
  Filled 2015-08-24: qty 50

## 2015-08-24 MED ORDER — FUROSEMIDE 10 MG/ML IJ SOLN
40.0000 mg | Freq: Once | INTRAMUSCULAR | Status: AC
  Administered 2015-08-24: 40 mg via INTRAVENOUS
  Filled 2015-08-24: qty 4

## 2015-08-24 MED ORDER — HYDROCORTISONE ACETATE 25 MG RE SUPP
25.0000 mg | Freq: Two times a day (BID) | RECTAL | Status: DC
  Administered 2015-08-24 – 2015-08-28 (×7): 25 mg via RECTAL
  Filled 2015-08-24 (×10): qty 1

## 2015-08-24 MED ORDER — SODIUM CHLORIDE 0.9 % IV BOLUS (SEPSIS)
500.0000 mL | Freq: Once | INTRAVENOUS | Status: AC
  Administered 2015-08-24: 500 mL via INTRAVENOUS

## 2015-08-24 NOTE — Consult Note (Signed)
CARDIOLOGY CONSULT NOTE   Patient ID: Nicole Randall MRN: [id number] DOB/AGE: [birth year] 70 y.o.  Admit date: 08/22/2015  Primary Physician   Gildardo Cranker, DO Primary Cardiologist   Dr Burt Knack, Dr Fletcher Anon to see for [facility] Reason for Consultation   afib  [id number] Amrhein is a 70 y.o. year old female with a history of afib on amio and Xarelto, CABG, HTN, OSA on CPAP, L-CEA, MR, PAD, ICM w/ EF 35-40% by echo 06/2015.  She was initially diagnosed with afib in [place], 10/2014. S/P DCCV several times, but did not stay in SR till started on amio. Moved to [place], now sees Dr Burt Knack.   Pt developed N&V on the day of admission and came to hospital. She was febrile, SBP 90s and was given IVF. Elevated lactic acid. Sepsis felt 2nd UTI. Her troponin was elevated and she had atrial fib 6:30-10:30 last pm, so cards asked to see her.   Pt has not had much in the way of palpitations since moving to [place]. She credits the amio. She has not had chest pain. She feels better now than on admission. She has not had LE edema, PND. She has chronic DOE and has developed orthopnea since admission.   Last pm, she does not remember having any palpitations. She had no presyncope or syncope. She is quite comfortable currently. She states that she keeps losing things in the bed however.     Past Medical History  Diagnosis Date  . CHF (congestive heart failure) ([place])   . Persistent atrial fibrillation ([place])   . Bone spur   . Arthritis   . Coronary artery disease     a. s/p CABG x2  . Hypertension   . OSA on CPAP   . Impaired memory   . Depression with anxiety   . Gait disturbance   . Osteoporosis   . GI bleed   . Carotid artery disease ([place])     a. s/p L CEA  . Mitral valvular regurgitation     a. moderate to severe  . PAD (peripheral artery disease) (H[facility])     a. s/p PTA of left internal iliac  . Cancer [facility])     carcinoma left leg     Past Surgical History  Procedure Laterality Date  .  Bypass graft    . Back surgery    . Appendectomy    . Coronary angioplasty    . Cholecystectomy    . Tonsillectomy    . Abdominal hysterectomy      partial  . Eye surgery      bilat cataract, lasix surgery  . Breast lumpectomy Left     1980's  . Esophagogastroduodenoscopy (egd) with propofol N/A 07/06/2015    Procedure: ESOPHAGOGASTRODUODENOSCOPY (EGD) WITH PROPOFOL;  Surgeon: Mauri Pole, MD;  Location: [facility]COPY;  Service: Endoscopy;  Laterality: N/A;  . Colonoscopy with propofol N/A 07/09/2015    Procedure: COLONOSCOPY WITH PROPOFOL;  Surgeon: Doran Stabler, MD;  Location: [facility] ENDOSCOPY;  Service: Endoscopy;  Laterality: N/A;    No Known Allergies  I have reviewed the patient's current medications . amiodarone  200 mg Oral Daily  . atorvastatin  80 mg Oral q1800  . busPIRone  7.5 mg Oral BID  . ceFEPime (MAXIPIME) IV  1 g Intravenous Q24H  . feeding supplement (ENSURE ENLIVE)  237 mL Oral BID BM  . ferrous sulfate  325 mg Oral BID WC  . gabapentin  300 mg  Oral BID  . Glycerin (Adult)  1 suppository Rectal Once  . magnesium sulfate 1 - 4 g bolus IVPB  2 g Intravenous Once  . midodrine  5 mg Oral TID WC  . oxyCODONE-acetaminophen  1 tablet Oral TID   And  . oxyCODONE  5 mg Oral TID  . sodium chloride flush  3 mL Intravenous Q12H  . traZODone  50 mg Oral QHS     acetaminophen, albuterol, calcium carbonate, nitroGLYCERIN, ondansetron **OR** ondansetron (ZOFRAN) IV, polyethylene glycol, senna, traMADol  Medication Sig  atorvastatin (LIPITOR) 80 MG tablet Take 80 mg by mouth daily.  busPIRone (BUSPAR) 7.5 MG tablet Take 7.5 mg by mouth 2 (two) times daily.  feeding supplement, ENSURE ENLIVE, (ENSURE ENLIVE) LIQD Take 237 mLs by mouth 2 (two) times daily between meals.  ferrous sulfate 325 (65 FE) MG tablet Take 1 tablet (325 mg total) by mouth 2 (two) times daily with a meal.  gabapentin (NEURONTIN) 300 MG capsule Take 300 mg by mouth 2 (two) times daily.     midodrine (PROAMATINE) 5 MG tablet Take 5 mg by mouth 3 (three) times daily with meals.  oxyCODONE-acetaminophen (PERCOCET) 10-325 MG tablet Take 1 tablet by mouth 3 (three) times daily.  potassium chloride (K-DUR,KLOR-CON) 10 MEQ tablet Take 10 mEq by mouth 2 (two) times daily.  Rivaroxaban (XARELTO) 15 MG TABS tablet Take 15 mg by mouth daily.   spironolactone (ALDACTONE) 25 MG tablet Take 12.5 mg by mouth daily. Take one-half tablet (12.5mg )  daily  traMADol (ULTRAM) 50 MG tablet Take 50 mg by mouth every 6 (six) hours as needed. Take 1-2 tablets (50-100mg ) every 4 hours as needed for pain  traZODone (DESYREL) 50 MG tablet Take 50 mg by mouth at bedtime.  acetaminophen (TYLENOL) 500 MG tablet Take 500 mg by mouth every 6 (six) hours as needed (body pain).  albuterol (PROVENTIL) (2.5 MG/3ML) 0.083% nebulizer solution Take 2.5 mg by nebulization every 6 (six) hours as needed for wheezing or shortness of breath.  calcium carbonate (TUMS EX) 750 MG chewable tablet Chew 1 tablet by mouth 3 (three) times daily as needed for heartburn.  Melatonin 5 MG CAPS Take 1 capsule by mouth at bedtime as needed (sleep).  nitroGLYCERIN (NITROSTAT) 0.4 MG SL tablet Place 1 tablet (0.4 mg total) under the tongue every 5 (five) minutes as needed for chest pain. Up to 3 doses  polyethylene glycol (MIRALAX / GLYCOLAX) packet Take 17 g by mouth daily as needed (constipation).   senna (SENOKOT) 8.6 MG tablet Take 1 tablet by mouth daily as needed for constipation.     Social History   Social History  . Marital Status: Widowed    Spouse Name: N/A  . Number of Children: N/A  . Years of Education: N/A   Occupational History  . Retired.    Social History Main Topics  . Smoking status: Current Some Day Smoker -- 0.20 packs/day  . Smokeless tobacco: Never Used  . Alcohol Use: No  . Drug Use: No  . Sexual Activity: Not on file   Other Topics Concern  . Not on file   Social History Narrative   Lives in  [place]. Daughter is nearby.    Family Status  Relation Status Death Age  . Mother Deceased   . Father Deceased   . Brother Alive   . Maternal Grandmother Deceased   . Maternal Grandfather Deceased   . Paternal Grandmother Deceased   . Paternal Grandfather Deceased  Family History  Problem Relation Age of Onset  . Lung cancer Mother   . Heart disease Father   . Throat cancer Brother      ROS:  Full 14 point review of systems complete and found to be negative unless listed above.  Physical Exam: Blood pressure 135/76, pulse 135, temperature 97.9 F (36.6 C), temperature source Oral, resp. rate 19, height 5\' 3"  (1.6 m), weight 103 lb (46.72 kg), SpO2 89 %.  General: Thin, female in no acute distress Head: Eyes PERRLA, No xanthomas.   Normocephalic and atraumatic, oropharynx without edema or exudate. Dentition: poor Lungs: rales bilateral bases Heart: HRRR S1 S2, no rub/gallop, soft murmur. pulses are 2+ both upper and R lower extrem. Decreased pulse LLE, rare ectopy Neck: No carotid bruits. No lymphadenopathy.  JVD 9 cm. Abdomen: Bowel sounds present, abdomen soft and non-tender without masses or hernias noted. Msk:  No spine or cva tenderness. No weakness, no joint deformities or effusions. Extremities: No clubbing or cyanosis. No edema.  Neuro: Alert and oriented X 3. No focal deficits noted. Psych:  Good affect, responds appropriately Skin: No rashes or lesions noted.  Labs:   Lab Results  Component Value Date   WBC 11.9* 08/24/2015   HGB 9.0* 08/24/2015   HCT 28.2* 08/24/2015   MCV 91.0 08/24/2015   PLT 71* 08/24/2015    Recent Labs  08/22/15 1310  INR 1.46     Recent Labs Lab 08/23/15 0554 08/24/15 0310  NA 143 144  K 3.6 3.5  CL 116* 120*  CO2 21* 20*  BUN 18 13  CREATININE 0.92 0.74  CALCIUM 7.7* 7.8*  PROT 5.4*  --   BILITOT 0.8  --   ALKPHOS 66  --   ALT 28  --   AST 62*  --   GLUCOSE 109* 91  ALBUMIN 2.2*  --     MAGNESIUM  Date Value Ref Range Status  08/24/2015 1.3* 1.7 - 2.4 mg/dL Final    Recent Labs  08/22/15 1310  TROPONINI 0.10*   LIPASE  Date/Time Value Ref Range Status  08/22/2015 01:10 PM 19 11 - 51 U/L Final   VITAMIN B-12  Date/Time Value Ref Range Status  08/22/2015 07:46 PM 963* 180 - 914 pg/mL Final   Echo: 06/2015 - Left ventricle: The cavity size was normal. Wall thickness was  normal. Systolic function was moderately reduced. The estimated  ejection fraction was in the range of 35% to 40%. There is  akinesis of the apicalanteroseptal, inferoseptal, and apical  myocardium. - Aortic valve: Trileaflet; mildly thickened, moderately calcified  leaflets. - Mitral valve: Calcified annulus. Mildly thickened leaflets .  There was moderate regurgitation. - Left atrium: The atrium was severely dilated. - Pulmonary arteries: Systolic pressure was mildly increased. PA  peak pressure: 36 mm Hg (S). Impressions: - EF has increased since prior study (35%)   ECG:  03/30 Prelim read Junctional, but P waves are visible, think SR  Radiology:  Ct Abdomen Pelvis W Contrast 08/22/2015  CLINICAL DATA:  Intractable nausea and vomiting. Leukocytosis. Fever. EXAM: CT ABDOMEN AND PELVIS WITH CONTRAST TECHNIQUE: Multidetector CT imaging of the abdomen and pelvis was performed using the standard protocol following bolus administration of intravenous contrast. CONTRAST:  59mL ISOVUE-300 IOPAMIDOL (ISOVUE-300) INJECTION 61% COMPARISON:  None. FINDINGS: Lower chest: There is prominent patchy consolidation in the dependent basilar right lower lobe. Mild patchy ground-glass opacity in the peripheral left lower lobe. Visualized sternotomy wire is intact. Myocardial calcifications at  the left ventricular apex from prior myocardial infarction. Coronary atherosclerosis. Hepatobiliary: Normal liver with no liver mass. Cholecystectomy. Bile ducts are within expected post cholecystectomy limits  with common bile duct diameter 5 mm. Pancreas: Atrophic appearing pancreas with no pancreatic mass or pancreatic duct dilation. Spleen: Normal size. No mass. Adrenals/Urinary Tract: Normal adrenals. No hydronephrosis. No renal mass. Normal bladder. There is crescentic high density material surrounding the urethra, possibly representing post treatment changes, correlate with surgical history. Stomach/Bowel: Grossly normal stomach. Normal caliber small bowel with no small bowel wall thickening. Status post appendectomy . Oral contrast progresses to the rectum. Moderate stool throughout the colon and rectum. There is a suggestion of mild wall thickening in the cecum, ascending colon and hepatic flexure of the colon with associated mild pericolonic fat stranding. Vascular/Lymphatic: Markedly atherosclerotic nonaneurysmal abdominal aorta. Patent portal, splenic, hepatic and renal veins. Large left splenorenal shunt. No pathologically enlarged lymph nodes in the abdomen or pelvis. Reproductive: Status post hysterectomy, with no abnormal findings at the vaginal cuff. No adnexal mass. Other: No pneumoperitoneum, ascites or focal fluid collection. Musculoskeletal: No aggressive appearing focal osseous lesions. Patient is status post bilateral posterior L5-S1 fusion, with no evidence of hardware fracture or loosening. Moderate degenerative changes in the visualized thoracolumbar spine. Left back subcutaneous spinal stimulator demonstrates 2 leads coursing into the thoracic spinal canal, with the tips not seen on this study. IMPRESSION: 1. Patchy basilar right lower lobe consolidation and mild patchy left lower lobe ground-glass opacities, favor multifocal pneumonia and/or aspiration. 2. Mild colonic wall thickening and pericolonic fat stranding in the proximal colon, suggesting a nonspecific mild infectious or inflammatory colitis, with the differential including C. diff colitis. No evidence of bowel obstruction. 3. Large  left splenorenal shunt of uncertain etiology. Portal, hepatic, splenic and renal veins appear patent. No macroscopic evidence of cirrhosis. No ascites. 4. Coronary atherosclerosis and remote myocardial infarct at the left ventricular apex. 5. Crescentic high density material surrounding the urethra, correlate with surgical history. Electronically Signed   By: Ilona Sorrel M.D.   On: 08/22/2015 16:25   Dg Chest Port 1 View 08/24/2015  CLINICAL DATA:  Shortness of breath and cough for 1 day EXAM: PORTABLE CHEST 1 VIEW COMPARISON:  August 23, 2015 FINDINGS: There is bibasilar interstitial edema with mild patchy alveolar opacity in the bases. Lungs elsewhere clear. Heart is mildly enlarged with pulmonary vascularity within normal limits. No adenopathy. Patient is status post coronary artery bypass grafting. Stimulator leads are noted in the mid thoracic region, stable. IMPRESSION: Bibasilar interstitial edema with patchy airspace opacity. Suspect a degree of congestive heart failure, although pneumonia or aspiration could present similarly. More than one of these entities may exist concurrently. Stable cardiac prominence. Electronically Signed   By: Lowella Grip III M.D.   On: 08/24/2015 10:20   Dg Chest Port 1 View 08/23/2015  CLINICAL DATA:  Patient with shortness of breath.  Vomiting. EXAM: PORTABLE CHEST 1 VIEW COMPARISON:  Chest radiograph 08/22/2015 FINDINGS: Stable enlarged cardiac and mediastinal contours status post median sternotomy and CABG procedure. Unchanged basilar heterogeneous opacities. No pleural effusion or pneumothorax. Osseous skeleton is unremarkable. IMPRESSION: Unchanged basilar heterogeneous opacities which may represent atelectasis, aspiration or infection. Electronically Signed   By: Lovey Newcomer M.D.   On: 08/23/2015 12:23   Dg Chest Port 1 View 08/22/2015  CLINICAL DATA:  70 year old female with sepsis. EXAM: PORTABLE CHEST 1 VIEW COMPARISON:  None. FINDINGS: Single-view of the  chest demonstrates bibasilar hazy densities which may be  atelectatic changes or represent pneumonia. There is no focal consolidation or pleural effusion. Linear density along the lateral aspect of the right lung most likely or skin fold. A small pneumothorax is much less likely. Repeat radiograph recommended if there is clinical concern for pneumothorax. There is mild cardiomegaly. Median sternotomy wires and CABG vascular clips noted. Spinal stimulator wires noted. No acute fracture. IMPRESSION: Bibasilar atelectasis/infiltrate. Skin fold artifact versus less likely a small right pneumothorax. Repeat radiograph is recommended if there is high clinical concern for pneumothorax. Electronically Signed   By: Anner Crete M.D.   On: 08/22/2015 20:32    ASSESSMENT AND PLAN:   The patient was seen today by MD, the patient evaluated and the data reviewed.  Active Problems: 1.  PAF (paroxysmal atrial fibrillation) (HCC) - pt had some premature beats and a great deal of heart rate variability - however, no clear afib seen.  - both ECGs are SR - she is at risk for afib with hx of this and acute illness - however, she is on Xarelto (is currently on hold because of hematochezia), and amio - would continue these. - feel HR elevation is in response to acute illness, so would not add Cardizem (especially in a pt on midodrine)  2.  Chronic anticoagulation - CHADS2VASC=5 - continue Xarelto  3.  Chronic systolic CHF (congestive heart failure) (HCC) - wt 103, at least 4 lbs below base weight - however, some volume overload after IVF - also with hyperchloremia 2nd IVF - Lasix 40 mg IV given x 1, agree - continue to track weights, I/O and BMET  Otherwise, per IM   Protein-calorie malnutrition, severe   Sepsis ([place])   UTI (lower urinary tract infection)   Emesis   Acute on chronic systolic CHF (congestive heart failure) ([place])   SignedRosaria Ferries, PA-C 08/24/2015 11:09 AM Beeper  [phone]  Personally seen and examined. Agree with above.  70 year old with history of paroxysmal atrial fibrillation on amiodarone, several cardioversions, Xarelto currently on hold because of bleed with ischemic cardiomyopathy EF 35-40% admitted with sepsis, intractable nausea and vomiting, severe malnutrition.  Paroxysmal atrial fibrillation -I agree with Suanne Marker that there seems to be some baseline artifact on telemetry monitoring and in certain situations what is thought to be atrial fibrillation is possibly sinus with frequent PACs or multifocal atrial rhythm. Regardless, she has had atrial fibrillation in the past and I do agree with amiodarone administration. She is currently stable with this. -Unfortunately, she is unable to utilize Xarelto currently because of recent bleed/hematochezia on 08/23/15. -Resume Xarelto when felt to be safe. -Previous upper GI bleed in early February 2017, gastroparesis, Xarelto was restarted at that time and aspirin was discontinued. -Seems to be under good control currently.  Elevated troponin -0.10-likely demand ischemia in the setting of her current acute illness.  Ischemic cardiomyopathy EF 35-40% -Currently not on Lasix, but was on spironolactone 12.5 mg a day as outpatient. -Hypotension seems to have been an issue especially given her Midodrine usage. -Lasix 40 mg IV 1 was administered.  Chronic anticoagulation -Xarelto 15 mg currently on hold because of hematochezia. GI evaluation. Not on aspirin.  Candee Furbish, MD

## 2015-08-24 NOTE — Progress Notes (Signed)
PROGRESS NOTE  Nicole Randall [id number] DOB: Jan 13, 1946 DOA: 08/22/2015 PCP: Gildardo Cranker, DO  Brief History 70 year old female with a history of persistent atrial fibrillation on Xarelto, systolic CHF, hypertension, coronary artery disease, peripheral vascular disease, hyperlipidemia, anxiety presented with intractable nausea and vomiting on the morning of admission. The patient woke up having numerous episodes of nausea and vomiting approximately 4:30 AM on 08/22/2015. It resolved for short period of time, but around 7:30 AM, the patient began having 3-4 episodes of emesis again. There was no hematemesis or coffee grounds material. Notably, the patient was recently discharged from the hospital after a stay between 07/04/2015 through 07/09/2015 for upper GI bleed And acute blood loss anemia. Colonoscopy on 07/09/2015 revealed diverticulosis and EGD on 07/06/2015 suggested gastroparesis. The patient's rivaroxaban was restarted and aspirin was discontinued at the time of that discharge.  In the emergency department, the patient was noted to have WBC 22.7, platelets 94,000 with fever 101.3F. The patienthad soft blood pressures with systolic blood pressures in the 90s. The patient was given 30 mL/kg IV fluid bolus and started on Zosyn. Lactic acid was 2.88. Serum creatinine was 1.12. Urinalysis showed TNTC WBC.the patient was aggressively fluid resuscitated. Unfortunately, the patient developed fluid overload and intravenous furosemide was started on 08/24/2015. In addition, the patient developed hematochezia on 08/23/2015. Rivaroxaban was discontinued on 08/23/15. The patient reverted back to atrial fibrillation on the afternoon of 08/23/2015.     Assessment/Plan: Sepsis -Secondary to genitourinary source -Continue cefepime pending culture data -Blood cultures 2 sets--neg to date -Urine culture--Ecoli -Chest x-ray--bibasilar atelectasis -Lactic acid  2.88 -Procalcitonin--12.72 -overall BP's improved--question whether overnight 3/30 BPs were spurious  UTI -Continue cefepime pending final culture data  Persistent atrial fibrillation -Discontinue xarelto due to recurrent hematochezia -Patient was not deemed to be a good candidate for Watchman device due to her comorbidities -CHADS-VASc = 5 -pt reverted back to afib -reconsulted cardiology  Hematochezia -07/09/2015 colonoscopy--moderate diverticulosis -07/06/2015 EGD--no source of GI bleed -08/23/15--hematochezia -suspect diverticular bleed in setting of rivaroxaban -08/24/15--consult GI  Intractable nausea and vomiting -CT abdomen and pelvis without contrast--monitor stool with mild wall thickening in the cecum and ascending colon--> Suspect this may be artifact as CT was without contrast and patient had colonoscopy approximately one month ago significant only for diverticulosis -Lipase 19 -resolved -Advance diet  Acute on Chronic systolic CHF (HFrEF) -[id number] echo EF 35-40 percent -Clinically compensated at this time -Initially held lasix and Aldactone secondary to hypotension -restart lasix IV  Severe malnutrition -Continue ensure  Thrombocytopenia -Suspect this is chronic -The patient had thrombocytopenia during her last admission -Check fibrinogen--378 -B12--963  Elevated troponin/CAD s/p CABG x 2  -No chest pain presently -Likely demand ischemia in the setting of sepsis -Continue Lipitor  Tobacco abuse -Still smoking. 4 cigarettes per day -Patient has nearly 100-pack-year history -Tobacco cessation discussed  Hypokalemia/hypomagnesemia -replete  Family Communication: Pt at beside Disposition Plan: home in 2-3 days  Procedures/Studies: Ct Abdomen Pelvis W Contrast  08/22/2015  CLINICAL DATA:  Intractable nausea and vomiting. Leukocytosis. Fever. EXAM: CT ABDOMEN AND PELVIS WITH CONTRAST TECHNIQUE: Multidetector CT imaging of the abdomen and  pelvis was performed using the standard protocol following bolus administration of intravenous contrast. CONTRAST:  74mL ISOVUE-300 IOPAMIDOL (ISOVUE-300) INJECTION 61% COMPARISON:  None. FINDINGS: Lower chest: There is prominent patchy consolidation in the dependent basilar right lower lobe. Mild patchy ground-glass opacity in the peripheral left lower lobe. Visualized sternotomy wire is intact. Myocardial  calcifications at the left ventricular apex from prior myocardial infarction. Coronary atherosclerosis. Hepatobiliary: Normal liver with no liver mass. Cholecystectomy. Bile ducts are within expected post cholecystectomy limits with common bile duct diameter 5 mm. Pancreas: Atrophic appearing pancreas with no pancreatic mass or pancreatic duct dilation. Spleen: Normal size. No mass. Adrenals/Urinary Tract: Normal adrenals. No hydronephrosis. No renal mass. Normal bladder. There is crescentic high density material surrounding the urethra, possibly representing post treatment changes, correlate with surgical history. Stomach/Bowel: Grossly normal stomach. Normal caliber small bowel with no small bowel wall thickening. Status post appendectomy . Oral contrast progresses to the rectum. Moderate stool throughout the colon and rectum. There is a suggestion of mild wall thickening in the cecum, ascending colon and hepatic flexure of the colon with associated mild pericolonic fat stranding. Vascular/Lymphatic: Markedly atherosclerotic nonaneurysmal abdominal aorta. Patent portal, splenic, hepatic and renal veins. Large left splenorenal shunt. No pathologically enlarged lymph nodes in the abdomen or pelvis. Reproductive: Status post hysterectomy, with no abnormal findings at the vaginal cuff. No adnexal mass. Other: No pneumoperitoneum, ascites or focal fluid collection. Musculoskeletal: No aggressive appearing focal osseous lesions. Patient is status post bilateral posterior L5-S1 fusion, with no evidence of hardware  fracture or loosening. Moderate degenerative changes in the visualized thoracolumbar spine. Left back subcutaneous spinal stimulator demonstrates 2 leads coursing into the thoracic spinal canal, with the tips not seen on this study. IMPRESSION: 1. Patchy basilar right lower lobe consolidation and mild patchy left lower lobe ground-glass opacities, favor multifocal pneumonia and/or aspiration. 2. Mild colonic wall thickening and pericolonic fat stranding in the proximal colon, suggesting a nonspecific mild infectious or inflammatory colitis, with the differential including C. diff colitis. No evidence of bowel obstruction. 3. Large left splenorenal shunt of uncertain etiology. Portal, hepatic, splenic and renal veins appear patent. No macroscopic evidence of cirrhosis. No ascites. 4. Coronary atherosclerosis and remote myocardial infarct at the left ventricular apex. 5. Crescentic high density material surrounding the urethra, correlate with surgical history. Electronically Signed   By: Ilona Sorrel M.D.   On: 08/22/2015 16:25   Dg Chest Port 1 View  08/23/2015  CLINICAL DATA:  Patient with shortness of breath.  Vomiting. EXAM: PORTABLE CHEST 1 VIEW COMPARISON:  Chest radiograph 08/22/2015 FINDINGS: Stable enlarged cardiac and mediastinal contours status post median sternotomy and CABG procedure. Unchanged basilar heterogeneous opacities. No pleural effusion or pneumothorax. Osseous skeleton is unremarkable. IMPRESSION: Unchanged basilar heterogeneous opacities which may represent atelectasis, aspiration or infection. Electronically Signed   By: Lovey Newcomer M.D.   On: 08/23/2015 12:23   Dg Chest Port 1 View  08/22/2015  CLINICAL DATA:  70 year old female with sepsis. EXAM: PORTABLE CHEST 1 VIEW COMPARISON:  None. FINDINGS: Single-view of the chest demonstrates bibasilar hazy densities which may be atelectatic changes or represent pneumonia. There is no focal consolidation or pleural effusion. Linear density  along the lateral aspect of the right lung most likely or skin fold. A small pneumothorax is much less likely. Repeat radiograph recommended if there is clinical concern for pneumothorax. There is mild cardiomegaly. Median sternotomy wires and CABG vascular clips noted. Spinal stimulator wires noted. No acute fracture. IMPRESSION: Bibasilar atelectasis/infiltrate. Skin fold artifact versus less likely a small right pneumothorax. Repeat radiograph is recommended if there is high clinical concern for pneumothorax. Electronically Signed   By: Anner Crete M.D.   On: 08/22/2015 20:32         Subjective: Patient complains of shortness of breath this morning. Denies any fevers,  chills, chest pain, nausea, vomiting, diarrhea, abdominal pain. No further hematochezia. No dysuria or hematuria.  Objective: Filed Vitals:   08/24/15 0400 08/24/15 0500 08/24/15 0600 08/24/15 0700  BP: 132/58 124/67    Pulse: 73 71  77  Temp: 97.9 F (36.6 C)     TempSrc: Oral     Resp: 14 13 18 23   Height:      Weight:      SpO2: 96% 96%  94%    Intake/Output Summary (Last 24 hours) at 08/24/15 0940 Last data filed at 08/23/15 2250  Gross per 24 hour  Intake 1113.33 ml  Output    900 ml  Net 213.33 ml   Weight change:  Exam:   General:  Pt is alert, follows commands appropriately, not in acute distress  HEENT: No icterus, No thrush, No neck mass, [place]/AT  Cardiovascular: RRR, S1/S2, no rubs, no gallops  Respiratory: bibasilar crackles. No wheezing. Good air movement  Abdomen: Soft/+BS, non tender, non distended, no guarding;no hepatosplenomegaly  Extremities: No edema, No lymphangitis, No petechiae, No rashes, no synovitis  Data Reviewed: Basic Metabolic Panel:  Recent Labs Lab 08/22/15 1310 08/23/15 0554 08/24/15 0310  NA 140 143 144  K 4.3 3.6 3.5  CL 104 116* 120*  CO2 26 21* 20*  GLUCOSE 109* 109* 91  BUN 19 18 13   CREATININE 1.12* 0.92 0.74  CALCIUM 9.5 7.7* 7.8*  MG  --   --   1.3*   Liver Function Tests:  Recent Labs Lab 08/22/15 1310 08/22/15 1946 08/23/15 0554  AST 49* 67* 62*  ALT 26 32 28  ALKPHOS 93 79 66  BILITOT 1.1 0.8 0.8  PROT 7.5 5.9* 5.4*  ALBUMIN 3.2* 2.4* 2.2*    Recent Labs Lab 08/22/15 1310  LIPASE 19   No results for input(s): AMMONIA in the last 168 hours. CBC:  Recent Labs Lab 08/22/15 1310 08/23/15 0554 08/24/15 0310  WBC 22.7* 19.7* 11.9*  NEUTROABS 20.9*  --   --   HGB 12.4 9.8* 9.0*  HCT 37.3 30.1* 28.2*  MCV 89.7 90.9 91.0  PLT 94* 75* 71*   Cardiac Enzymes:  Recent Labs Lab 08/22/15 1310  TROPONINI 0.10*   BNP: Invalid input(s): POCBNP CBG: No results for input(s): GLUCAP in the last 168 hours.  Recent Results (from the past 240 hour(s))  Culture, blood (routine x 2)     Status: None (Preliminary result)   Collection Time: 08/22/15  1:19 PM  Result Value Ref Range Status   Specimen Description BLOOD RIGHT ANTECUBITAL  Final   Special Requests BOTTLES DRAWN AEROBIC AND ANAEROBIC 5ML  Final   Culture   Final    NO GROWTH < 24 HOURS Performed at [facility]    Report Status PENDING  Incomplete  Culture, blood (routine x 2)     Status: None (Preliminary result)   Collection Time: 08/22/15  1:21 PM  Result Value Ref Range Status   Specimen Description BLOOD LEFT FOREARM  Final   Special Requests BOTTLES DRAWN AEROBIC AND ANAEROBIC 5ML  Final   Culture   Final    NO GROWTH < 24 HOURS Performed at [facility]    Report Status PENDING  Incomplete  Urine culture     Status: None   Collection Time: 08/22/15  1:34 PM  Result Value Ref Range Status   Specimen Description URINE, RANDOM  Final   Special Requests NONE  Final   Culture   Final    >=  100,000 COLONIES/mL ESCHERICHIA COLI Performed at [facility]    Report Status 08/24/2015 FINAL  Final   Organism ID, Bacteria ESCHERICHIA COLI  Final      Susceptibility   Escherichia coli - MIC*    AMPICILLIN <=2 SENSITIVE  Sensitive     CEFAZOLIN <=4 SENSITIVE Sensitive     CEFTRIAXONE <=1 SENSITIVE Sensitive     CIPROFLOXACIN <=0.25 SENSITIVE Sensitive     GENTAMICIN <=1 SENSITIVE Sensitive     IMIPENEM <=0.25 SENSITIVE Sensitive     NITROFURANTOIN <=16 SENSITIVE Sensitive     TRIMETH/SULFA <=20 SENSITIVE Sensitive     AMPICILLIN/SULBACTAM <=2 SENSITIVE Sensitive     PIP/TAZO <=4 SENSITIVE Sensitive     * >=100,000 COLONIES/mL ESCHERICHIA COLI  MRSA PCR Screening     Status: None   Collection Time: 08/22/15  7:35 PM  Result Value Ref Range Status   MRSA by PCR NEGATIVE NEGATIVE Final    Comment:        The GeneXpert MRSA Assay (FDA approved for NASAL specimens only), is one component of a comprehensive MRSA colonization surveillance program. It is not intended to diagnose MRSA infection nor to guide or monitor treatment for MRSA infections.      Scheduled Meds: . amiodarone  200 mg Oral Daily  . atorvastatin  80 mg Oral q1800  . busPIRone  7.5 mg Oral BID  . ceFEPime (MAXIPIME) IV  1 g Intravenous Q24H  . feeding supplement (ENSURE ENLIVE)  237 mL Oral BID BM  . ferrous sulfate  325 mg Oral BID WC  . furosemide  40 mg Intravenous Once  . gabapentin  300 mg Oral BID  . Glycerin (Adult)  1 suppository Rectal Once  . midodrine  5 mg Oral TID WC  . oxyCODONE-acetaminophen  1 tablet Oral TID   And  . oxyCODONE  5 mg Oral TID  . sodium chloride flush  3 mL Intravenous Q12H  . traZODone  50 mg Oral QHS   Continuous Infusions: . 0.9 % NaCl with KCl 20 mEq / L 100 mL/hr at 08/24/15 0743     Luretha Eberly, DO  [facility] Pager [phone]  If 7PM-7AM, please contact night-coverage [email or web address] Password [email or web address] 08/24/2015, 9:40 AM   LOS: 2 days

## 2015-08-24 NOTE — Progress Notes (Signed)
Referring Provider: No ref. provider found Primary Care Physician:  Gildardo Cranker, DO Primary Gastroenterologist:  Dr. Reyne Dumas; has not been seen in office  Reason for Consultation:  Hematochezia  HPI: Nicole Randall is a 70 y.o. female with a history of persistent atrial fibrillation on Xarelto, systolic CHF, hypertension, coronary artery disease, peripheral vascular disease, hyperlipidemia, anxiety presented with intractable nausea and vomiting on the morning of admission. The patient woke up having numerous episodes of nausea and vomiting approximately 4:30 AM on 08/22/2015. It resolved for short period of time, but around 7:30 AM, the patient began having 3-4 episodes of emesis again. There was no hematemesis or coffee grounds material. Notably, the patient was recently discharged from the hospital after a stay between 07/04/2015 through 07/09/2015 for upper GI bleedand acute blood loss anemia. Colonoscopy on 07/09/2015 revealed diverticulosis and EGD on 07/06/2015 suggested gastroparesis but no source of bleeding found. The patient's rivaroxaban was restarted and aspirin was discontinued at the time of that discharge.  Patient being treated for urosepsis and has been in and out of atrial fibrillation.  She apparently had some hematochezia on 3/30 so GI has been consulted.  Patient is not able to provide much information, but reports from nursing staff give the impression that this bleeding was minimal.  Her Hgb is hanging in the 9 gram range, which has been her baseline recently.  Her Xarelto has been held again.  Has high CHADS score and cardiology is seeing her as well.    -07/09/2015 colonoscopy--moderate diverticulosis -07/06/2015 EGD--no source of GI bleed   Past Medical History  Diagnosis Date  . CHF (congestive heart failure) ([place])   . Persistent atrial fibrillation ([place])   . Bone spur   . Arthritis   . Coronary artery disease     a. s/p CABG x2  .  Hypertension   . OSA on CPAP   . Impaired memory   . Depression with anxiety   . Gait disturbance   . Osteoporosis   . GI bleed   . Carotid artery disease ([place])     a. s/p L CEA  . Mitral valvular regurgitation     a. moderate to severe  . PAD (peripheral artery disease) ([facility])     a. s/p PTA of left internal iliac  . Cancer [facility])     carcinoma left leg    Past Surgical History  Procedure Laterality Date  . Bypass graft    . Back surgery    . Appendectomy    . Coronary angioplasty    . Cholecystectomy    . Tonsillectomy    . Abdominal hysterectomy      partial  . Eye surgery      bilat cataract, lasix surgery  . Breast lumpectomy Left     1980's  . Esophagogastroduodenoscopy (egd) with propofol N/A 07/06/2015    Procedure: ESOPHAGOGASTRODUODENOSCOPY (EGD) WITH PROPOFOL;  Surgeon: Mauri Pole, MD;  Location: [facility] ENDOSCOPY;  Service: Endoscopy;  Laterality: N/A;  . Colonoscopy with propofol N/A 07/09/2015    Procedure: COLONOSCOPY WITH PROPOFOL;  Surgeon: Doran Stabler, MD;  Location: [facility] ENDOSCOPY;  Service: Endoscopy;  Laterality: N/A;    Prior to Admission medications   Medication Sig Start Date End Date Taking? Authorizing Provider  atorvastatin (LIPITOR) 80 MG tablet Take 80 mg by mouth daily.   Yes Historical Provider, MD  busPIRone (BUSPAR) 7.5 MG tablet Take 7.5 mg by mouth 2 (two) times daily.   Yes Historical Provider,  MD  feeding supplement, ENSURE ENLIVE, (ENSURE ENLIVE) LIQD Take 237 mLs by mouth 2 (two) times daily between meals. 07/09/15  Yes Modena Jansky, MD  ferrous sulfate 325 (65 FE) MG tablet Take 1 tablet (325 mg total) by mouth 2 (two) times daily with a meal. 07/09/15  Yes Modena Jansky, MD  gabapentin (NEURONTIN) 300 MG capsule Take 300 mg by mouth 2 (two) times daily.    Yes Historical Provider, MD  midodrine (PROAMATINE) 5 MG tablet Take 5 mg by mouth 3 (three) times daily with meals.   Yes Historical Provider, MD    oxyCODONE-acetaminophen (PERCOCET) 10-325 MG tablet Take 1 tablet by mouth 3 (three) times daily.   Yes Historical Provider, MD  potassium chloride (K-DUR,KLOR-CON) 10 MEQ tablet Take 10 mEq by mouth 2 (two) times daily.   Yes Historical Provider, MD  Rivaroxaban (XARELTO) 15 MG TABS tablet Take 15 mg by mouth daily.    Yes Historical Provider, MD  spironolactone (ALDACTONE) 25 MG tablet Take 12.5 mg by mouth daily. Take one-half tablet (12.5mg )  daily   Yes Historical Provider, MD  traMADol (ULTRAM) 50 MG tablet Take 50 mg by mouth every 6 (six) hours as needed. Take 1-2 tablets (50-100mg ) every 4 hours as needed for pain   Yes Historical Provider, MD  traZODone (DESYREL) 50 MG tablet Take 50 mg by mouth at bedtime.   Yes Historical Provider, MD  acetaminophen (TYLENOL) 500 MG tablet Take 500 mg by mouth every 6 (six) hours as needed (body pain).    Historical Provider, MD  albuterol (PROVENTIL) (2.5 MG/3ML) 0.083% nebulizer solution Take 2.5 mg by nebulization every 6 (six) hours as needed for wheezing or shortness of breath.    Historical Provider, MD  calcium carbonate (TUMS EX) 750 MG chewable tablet Chew 1 tablet by mouth 3 (three) times daily as needed for heartburn.    Historical Provider, MD  Melatonin 5 MG CAPS Take 1 capsule by mouth at bedtime as needed (sleep).    Historical Provider, MD  nitroGLYCERIN (NITROSTAT) 0.4 MG SL tablet Place 1 tablet (0.4 mg total) under the tongue every 5 (five) minutes as needed for chest pain. Up to 3 doses 07/23/15   Imogene Burn, PA-C  polyethylene glycol [facility] / GLYCOLAX) packet Take 17 g by mouth daily as needed (constipation).     Historical Provider, MD  senna (SENOKOT) 8.6 MG tablet Take 1 tablet by mouth daily as needed for constipation.    Historical Provider, MD    Current Facility-Administered Medications  Medication Dose Route Frequency Provider Last Rate Last Dose  . acetaminophen (TYLENOL) tablet 500 mg  500 mg Oral Q6H PRN Orson Eva,  MD      . albuterol (PROVENTIL) (2.5 MG/3ML) 0.083% nebulizer solution 2.5 mg  2.5 mg Nebulization Q6H PRN Orson Eva, MD      . amiodarone (PACERONE) tablet 200 mg  200 mg Oral Daily Orson Eva, MD   200 mg at 08/24/15 1044  . atorvastatin (LIPITOR) tablet 80 mg  80 mg Oral q1800 Orson Eva, MD   80 mg at 08/23/15 1737  . busPIRone (BUSPAR) tablet 7.5 mg  7.5 mg Oral BID Orson Eva, MD   7.5 mg at 08/24/15 1045  . calcium carbonate (TUMS - dosed in mg elemental calcium) chewable tablet 200 mg of elemental calcium  1 tablet Oral TID PRN Orson Eva, MD      . ceFEPIme (MAXIPIME) 1 g in dextrose 5 % 50 mL  IVPB  1 g Intravenous Q24H Orson Eva, MD   1 g at 08/23/15 2207  . feeding supplement (ENSURE ENLIVE) (ENSURE ENLIVE) liquid 237 mL  237 mL Oral BID BM Orson Eva, MD   237 mL at 08/24/15 1048  . ferrous sulfate tablet 325 mg  325 mg Oral BID WC Orson Eva, MD   325 mg at 08/24/15 0744  . gabapentin (NEURONTIN) capsule 300 mg  300 mg Oral BID Orson Eva, MD   300 mg at 08/24/15 1044  . Glycerin (Adult) 2.1 g suppository 1 suppository  1 suppository Rectal Once Charlesetta Shanks, MD      . magnesium sulfate IVPB 2 g 50 mL  2 g Intravenous Once Orson Eva, MD   2 g at 08/24/15 1044  . midodrine (PROAMATINE) tablet 5 mg  5 mg Oral TID WC Orson Eva, MD   5 mg at 08/24/15 1045  . nitroGLYCERIN (NITROSTAT) SL tablet 0.4 mg  0.4 mg Sublingual Q5 min PRN Orson Eva, MD      . ondansetron [facility]) tablet 4 mg  4 mg Oral Q6H PRN Orson Eva, MD       Or  . ondansetron [facility]) injection 4 mg  4 mg Intravenous Q6H PRN Orson Eva, MD      . oxyCODONE-acetaminophen (PERCOCET/ROXICET) 5-325 MG per tablet 1 tablet  1 tablet Oral TID Orson Eva, MD   1 tablet at 08/24/15 1044   And  . oxyCODONE (Oxy IR/ROXICODONE) immediate release tablet 5 mg  5 mg Oral TID Orson Eva, MD   5 mg at 08/24/15 1044  . polyethylene glycol (MIRALAX / GLYCOLAX) packet 17 g  17 g Oral Daily PRN Orson Eva, MD      . senna (SENOKOT) tablet 8.6 mg  1  tablet Oral Daily PRN Shanon Brow Tat, MD      . sodium chloride flush (NS) 0.9 % injection 3 mL  3 mL Intravenous Q12H Orson Eva, MD   3 mL at 08/24/15 1047  . traMADol (ULTRAM) tablet 50 mg  50 mg Oral Q6H PRN Orson Eva, MD      . traZODone (DESYREL) tablet 50 mg  50 mg Oral QHS Orson Eva, MD   50 mg at 08/23/15 2315    Allergies as of 08/22/2015  . (No Known Allergies)    Family History  Problem Relation Age of Onset  . Lung cancer Mother   . Heart disease Father   . Throat cancer Brother     Social History   Social History  . Marital Status: Widowed    Spouse Name: N/A  . Number of Children: N/A  . Years of Education: N/A   Occupational History  . Retired.    Social History Main Topics  . Smoking status: Current Some Day Smoker -- 0.20 packs/day  . Smokeless tobacco: Never Used  . Alcohol Use: No  . Drug Use: No  . Sexual Activity: Not on file   Other Topics Concern  . Not on file   Social History Narrative   Lives in [place]. Daughter is nearby.    Review of Systems: Ten point ROS is O/W negative except as mentioned in HPI.  Physical Exam: Vital signs in last 24 hours: Temp:  [97.9 F (36.6 C)-99.6 F (37.6 C)] 97.9 F (36.6 C) (03/31 0400) Pulse Rate:  [66-135] 104 (03/31 0900) Resp:  [13-28] 21 (03/31 0900) BP: (74-159)/(30-85) 151/71 mmHg (03/31 0900) SpO2:  [86 %-99 %] 93 % (03/31  0900) Last BM Date: 08/23/15 General:  Alert, Well-developed, chronically ill-appearing, pleasant and cooperative in NAD Head:  Normocephalic and atraumatic. Eyes:  Sclera clear, no icterus.  Conjunctiva pink. Ears:  Normal auditory acuity.  Lungs:  Bibasilar crackles noted. Heart:  Irregular.  Slightly tachy. Abdomen:  Soft, slightly distended.  BS present.  Non-tender. Rectal:  Deferred  Msk:  Symmetrical without gross deformities. Pulses:  Normal pulses noted. Extremities:  Without clubbing or edema. Neurologic:  Alert and oriented x 4;  grossly  normal neurologically. Skin:  Intact without significant lesions or rashes. Psych:  Alert and cooperative. Normal mood and affect.  Intake/Output from previous day: 03/30 0701 - 03/31 0700 In: 1363.3 [I.V.:1313.3; IV Piggyback:50] Out: 900 [Urine:900] Intake/Output this shift: Total I/O In: 400 [I.V.:350; IV Piggyback:50] Out: 900 [Urine:900]  Lab Results:  Recent Labs  08/22/15 1310 08/23/15 0554 08/24/15 0310  WBC 22.7* 19.7* 11.9*  HGB 12.4 9.8* 9.0*  HCT 37.3 30.1* 28.2*  PLT 94* 75* 71*   BMET  Recent Labs  08/22/15 1310 08/23/15 0554 08/24/15 0310  NA 140 143 144  K 4.3 3.6 3.5  CL 104 116* 120*  CO2 26 21* 20*  GLUCOSE 109* 109* 91  BUN 19 18 13   CREATININE 1.12* 0.92 0.74  CALCIUM 9.5 7.7* 7.8*   LFT  Recent Labs  08/22/15 1946 08/23/15 0554  PROT 5.9* 5.4*  ALBUMIN 2.4* 2.2*  AST 67* 62*  ALT 32 28  ALKPHOS 79 66  BILITOT 0.8 0.8  BILIDIR 0.2  --   IBILI 0.6  --    PT/INR  Recent Labs  08/22/15 1310  LABPROT 17.8*  INR 1.46   Studies/Results: Ct Abdomen Pelvis W Contrast  08/22/2015  CLINICAL DATA:  Intractable nausea and vomiting. Leukocytosis. Fever. EXAM: CT ABDOMEN AND PELVIS WITH CONTRAST TECHNIQUE: Multidetector CT imaging of the abdomen and pelvis was performed using the standard protocol following bolus administration of intravenous contrast. CONTRAST:  73mL ISOVUE-300 IOPAMIDOL (ISOVUE-300) INJECTION 61% COMPARISON:  None. FINDINGS: Lower chest: There is prominent patchy consolidation in the dependent basilar right lower lobe. Mild patchy ground-glass opacity in the peripheral left lower lobe. Visualized sternotomy wire is intact. Myocardial calcifications at the left ventricular apex from prior myocardial infarction. Coronary atherosclerosis. Hepatobiliary: Normal liver with no liver mass. Cholecystectomy. Bile ducts are within expected post cholecystectomy limits with common bile duct diameter 5 mm. Pancreas: Atrophic appearing  pancreas with no pancreatic mass or pancreatic duct dilation. Spleen: Normal size. No mass. Adrenals/Urinary Tract: Normal adrenals. No hydronephrosis. No renal mass. Normal bladder. There is crescentic high density material surrounding the urethra, possibly representing post treatment changes, correlate with surgical history. Stomach/Bowel: Grossly normal stomach. Normal caliber small bowel with no small bowel wall thickening. Status post appendectomy . Oral contrast progresses to the rectum. Moderate stool throughout the colon and rectum. There is a suggestion of mild wall thickening in the cecum, ascending colon and hepatic flexure of the colon with associated mild pericolonic fat stranding. Vascular/Lymphatic: Markedly atherosclerotic nonaneurysmal abdominal aorta. Patent portal, splenic, hepatic and renal veins. Large left splenorenal shunt. No pathologically enlarged lymph nodes in the abdomen or pelvis. Reproductive: Status post hysterectomy, with no abnormal findings at the vaginal cuff. No adnexal mass. Other: No pneumoperitoneum, ascites or focal fluid collection. Musculoskeletal: No aggressive appearing focal osseous lesions. Patient is status post bilateral posterior L5-S1 fusion, with no evidence of hardware fracture or loosening. Moderate degenerative changes in the visualized thoracolumbar spine. Left back subcutaneous spinal stimulator  demonstrates 2 leads coursing into the thoracic spinal canal, with the tips not seen on this study. IMPRESSION: 1. Patchy basilar right lower lobe consolidation and mild patchy left lower lobe ground-glass opacities, favor multifocal pneumonia and/or aspiration. 2. Mild colonic wall thickening and pericolonic fat stranding in the proximal colon, suggesting a nonspecific mild infectious or inflammatory colitis, with the differential including C. diff colitis. No evidence of bowel obstruction. 3. Large left splenorenal shunt of uncertain etiology. Portal, hepatic,  splenic and renal veins appear patent. No macroscopic evidence of cirrhosis. No ascites. 4. Coronary atherosclerosis and remote myocardial infarct at the left ventricular apex. 5. Crescentic high density material surrounding the urethra, correlate with surgical history. Electronically Signed   By: Ilona Sorrel M.D.   On: 08/22/2015 16:25   Dg Chest Port 1 View  08/24/2015  CLINICAL DATA:  Shortness of breath and cough for 1 day EXAM: PORTABLE CHEST 1 VIEW COMPARISON:  August 23, 2015 FINDINGS: There is bibasilar interstitial edema with mild patchy alveolar opacity in the bases. Lungs elsewhere clear. Heart is mildly enlarged with pulmonary vascularity within normal limits. No adenopathy. Patient is status post coronary artery bypass grafting. Stimulator leads are noted in the mid thoracic region, stable. IMPRESSION: Bibasilar interstitial edema with patchy airspace opacity. Suspect a degree of congestive heart failure, although pneumonia or aspiration could present similarly. More than one of these entities may exist concurrently. Stable cardiac prominence. Electronically Signed   By: Lowella Grip III M.D.   On: 08/24/2015 10:20   Dg Chest Port 1 View  08/23/2015  CLINICAL DATA:  Patient with shortness of breath.  Vomiting. EXAM: PORTABLE CHEST 1 VIEW COMPARISON:  Chest radiograph 08/22/2015 FINDINGS: Stable enlarged cardiac and mediastinal contours status post median sternotomy and CABG procedure. Unchanged basilar heterogeneous opacities. No pleural effusion or pneumothorax. Osseous skeleton is unremarkable. IMPRESSION: Unchanged basilar heterogeneous opacities which may represent atelectasis, aspiration or infection. Electronically Signed   By: Lovey Newcomer M.D.   On: 08/23/2015 12:23   Dg Chest Port 1 View  08/22/2015  CLINICAL DATA:  70 year old female with sepsis. EXAM: PORTABLE CHEST 1 VIEW COMPARISON:  None. FINDINGS: Single-view of the chest demonstrates bibasilar hazy densities which may be  atelectatic changes or represent pneumonia. There is no focal consolidation or pleural effusion. Linear density along the lateral aspect of the right lung most likely or skin fold. A small pneumothorax is much less likely. Repeat radiograph recommended if there is clinical concern for pneumothorax. There is mild cardiomegaly. Median sternotomy wires and CABG vascular clips noted. Spinal stimulator wires noted. No acute fracture. IMPRESSION: Bibasilar atelectasis/infiltrate. Skin fold artifact versus less likely a small right pneumothorax. Repeat radiograph is recommended if there is high clinical concern for pneumothorax. Electronically Signed   By: Anner Crete M.D.   On: 08/22/2015 20:32   IMPRESSION:  -GI bleeding in the setting of Xarelto anticoagulation:  Sounds hemorrhoidal.  Low-grade bleeding at this point.  Hgb stable/at baseline.  Recent EGD and colonoscopy with only diverticulosis noted. -Sepsis:  Secondary to urinary source.  On antibiotics. -Atrial fibrillation:  Xarelto on hold due to GI bleeding.  PLAN: -Will give Miralax daily to keep bowels soft. -Treat empirically with hydrocortisone suppositories for now.   -Monitor Hgb. -Ok to restart Xarelto from GI standpoint if needed by cardiology.  ZEHR, JESSICA D.  08/24/2015, 11:41 AM  Pager number [phone]   Attending physician's note   I have taken a history, examined the patient and reviewed the chart.  I agree with the Advanced Practitioner's note, impression and recommendations. Patient reports that she had small volume blood per rectum, ?streaks of blood after she strained yesterday. Likely hemorrhoidal vs anal fissure. Seems less likely to be diverticular hemorrhage given the small volume and also no significant change in Hgb. Will hold off repeat endoscopic evaluation given she had EGD and colonoscopy few weeks with no significant pathology. Hydrocortisone suppository daily at bedtime x 7-10 days and titrate Miralax as needed  to have a soft BM. Ok to restart Xarelto if indicated.    Damaris Hippo, MD [phone] Mon-Fri 8a-5p [phone] after 5p, weekends, holidays

## 2015-08-25 DIAGNOSIS — A4151 Sepsis due to Escherichia coli [E. coli]: Secondary | ICD-10-CM

## 2015-08-25 LAB — CBC
HCT: 27.9 % — ABNORMAL LOW (ref 36.0–46.0)
HEMOGLOBIN: 9.2 g/dL — AB (ref 12.0–15.0)
MCH: 29.1 pg (ref 26.0–34.0)
MCHC: 33 g/dL (ref 30.0–36.0)
MCV: 88.3 fL (ref 78.0–100.0)
PLATELETS: 78 10*3/uL — AB (ref 150–400)
RBC: 3.16 MIL/uL — AB (ref 3.87–5.11)
RDW: 23.3 % — ABNORMAL HIGH (ref 11.5–15.5)
WBC: 7.8 10*3/uL (ref 4.0–10.5)

## 2015-08-25 LAB — BASIC METABOLIC PANEL
ANION GAP: 4 — AB (ref 5–15)
BUN: 10 mg/dL (ref 6–20)
CHLORIDE: 116 mmol/L — AB (ref 101–111)
CO2: 23 mmol/L (ref 22–32)
CREATININE: 0.73 mg/dL (ref 0.44–1.00)
Calcium: 7.9 mg/dL — ABNORMAL LOW (ref 8.9–10.3)
GFR calc non Af Amer: >60 mL/min (ref >60)
Glucose, Bld: 75 mg/dL (ref 65–99)
POTASSIUM: 3.6 mmol/L (ref 3.5–5.1)
SODIUM: 143 mmol/L (ref 135–145)

## 2015-08-25 LAB — MAGNESIUM: Magnesium: 1.6 mg/dL — ABNORMAL LOW (ref 1.7–2.4)

## 2015-08-25 MED ORDER — SODIUM CHLORIDE 0.9 % IV BOLUS (SEPSIS)
500.0000 mL | Freq: Once | INTRAVENOUS | Status: AC
  Administered 2015-08-25: 500 mL via INTRAVENOUS

## 2015-08-25 MED ORDER — RIVAROXABAN 15 MG PO TABS
15.0000 mg | ORAL_TABLET | Freq: Every day | ORAL | Status: DC
  Administered 2015-08-25 – 2015-08-28 (×4): 15 mg via ORAL
  Filled 2015-08-25 (×4): qty 1

## 2015-08-25 MED ORDER — CHLORHEXIDINE GLUCONATE 0.12 % MT SOLN
15.0000 mL | Freq: Two times a day (BID) | OROMUCOSAL | Status: DC
  Administered 2015-08-25 – 2015-08-28 (×6): 15 mL via OROMUCOSAL
  Filled 2015-08-25 (×7): qty 15

## 2015-08-25 MED ORDER — CETYLPYRIDINIUM CHLORIDE 0.05 % MT LIQD
7.0000 mL | Freq: Two times a day (BID) | OROMUCOSAL | Status: DC
  Administered 2015-08-27 – 2015-08-28 (×4): 7 mL via OROMUCOSAL

## 2015-08-25 MED ORDER — MAGNESIUM SULFATE 4 GM/100ML IV SOLN
4.0000 g | Freq: Once | INTRAVENOUS | Status: AC
  Administered 2015-08-25: 4 g via INTRAVENOUS
  Filled 2015-08-25: qty 100

## 2015-08-25 MED ORDER — AMOXICILLIN 500 MG PO CAPS
500.0000 mg | ORAL_CAPSULE | Freq: Three times a day (TID) | ORAL | Status: DC
  Administered 2015-08-25 – 2015-08-28 (×9): 500 mg via ORAL
  Filled 2015-08-25 (×10): qty 1

## 2015-08-25 NOTE — Progress Notes (Signed)
Subjective: No CP  NO SOB   Objective: Filed Vitals:   08/25/15 0300 08/25/15 0400 08/25/15 0500 08/25/15 0600  BP: 98/40 92/44 105/66 102/37  Pulse: 60 66 63   Temp:      TempSrc:      Resp: 14 14 13 23   Height:      Weight:      SpO2: 98% 98% 100%    Weight change:   Intake/Output Summary (Last 24 hours) at 08/25/15 0708 Last data filed at 08/25/15 0528  Gross per 24 hour  Intake    600 ml  Output   2700 ml  Net  -2100 ml   Net 2.27 L negative   General: Alert, awake, oriented x3, in no acute distress Neck:  JVP is normal Heart: Regular rate and rhythm, without murmurs, rubs, gallops.  Lungs: Occasional rales   Exemities:  No edema.   Neuro: Grossly intact, nonfocal.  Tele:  SR    Lab Results: Results for orders placed or performed during the hospital encounter of 08/22/15 (from the past 24 hour(s))  Basic metabolic panel     Status: Abnormal   Collection Time: 08/25/15  3:28 AM  Result Value Ref Range   Sodium 143 135 - 145 mmol/L   Potassium 3.6 3.5 - 5.1 mmol/L   Chloride 116 (H) 101 - 111 mmol/L   CO2 23 22 - 32 mmol/L   Glucose, Bld 75 65 - 99 mg/dL   BUN 10 6 - 20 mg/dL   Creatinine, Ser 0.73 0.44 - 1.00 mg/dL   Calcium 7.9 (L) 8.9 - 10.3 mg/dL   GFR calc non Af Amer >60 >60 mL/min   GFR calc Af Amer >60 >60 mL/min   Anion gap 4 (L) 5 - 15  CBC     Status: Abnormal   Collection Time: 08/25/15  3:28 AM  Result Value Ref Range   WBC 7.8 4.0 - 10.5 K/uL   RBC 3.16 (L) 3.87 - 5.11 MIL/uL   Hemoglobin 9.2 (L) 12.0 - 15.0 g/dL   HCT 27.9 (L) 36.0 - 46.0 %   MCV 88.3 78.0 - 100.0 fL   MCH 29.1 26.0 - 34.0 pg   MCHC 33.0 30.0 - 36.0 g/dL   RDW 23.3 (H) 11.5 - 15.5 %   Platelets 78 (L) 150 - 400 K/uL  Magnesium     Status: Abnormal   Collection Time: 08/25/15  3:28 AM  Result Value Ref Range   Magnesium 1.6 (L) 1.7 - 2.4 mg/dL    Studies/Results: Dg Chest Port 1 View  08/24/2015  CLINICAL DATA:  Shortness of breath and cough for 1 day EXAM:  PORTABLE CHEST 1 VIEW COMPARISON:  August 23, 2015 FINDINGS: There is bibasilar interstitial edema with mild patchy alveolar opacity in the bases. Lungs elsewhere clear. Heart is mildly enlarged with pulmonary vascularity within normal limits. No adenopathy. Patient is status post coronary artery bypass grafting. Stimulator leads are noted in the mid thoracic region, stable. IMPRESSION: Bibasilar interstitial edema with patchy airspace opacity. Suspect a degree of congestive heart failure, although pneumonia or aspiration could present similarly. More than one of these entities may exist concurrently. Stable cardiac prominence. Electronically Signed   By: Lowella Grip III M.D.   On: 08/24/2015 10:20    Medications: Reviewed  @PROBHOSP @  1  Rhythm  Remains in SR  Continue tele   Continue amiiodarone    2  Chronic anticoag  Currently on hold  3  Acute  on chronic sysstolic CHF   Volume may be up mildly  With BP would not diurese furhter for now   LOS: 3 days   Dorris Carnes 08/25/2015, 7:08 AM

## 2015-08-25 NOTE — Progress Notes (Signed)
[facility] Progress Note  Subjective:  No further bleeding per nursing reports.  Patient feels and looks better today.  Objective:  Vital signs in last 24 hours: Temp:  [97.9 F (36.6 C)-98.5 F (36.9 C)] 98.1 F (36.7 C) (04/01 0800) Pulse Rate:  [44-118] 63 (04/01 0500) Resp:  [13-27] 23 (04/01 0600) BP: (68-185)/(29-115) 102/37 mmHg (04/01 0600) SpO2:  [94 %-100 %] 100 % (04/01 0500) Last BM Date: 08/23/15 General:  Alert, Well-developed, in NAD Heart:  RRR.  No M/R/G. Pulm:  Some crackles noted. Abdomen:  Soft, non-distended.  BS present.  Non-tender. Extremities:  Without edema. Neurologic:  Alert and oriented x 4;  grossly normal neurologically. Psych:  Alert and cooperative. Normal mood and affect.  Intake/Output from previous day: 03/31 0701 - 04/01 0700 In: 600 [I.V.:350; IV Piggyback:250] Out: 2700 [Urine:2700] Intake/Output this shift: Total I/O In: 3 [I.V.:3] Out: 925 [Urine:925]  Lab Results:  Recent Labs  08/23/15 0554 08/24/15 0310 08/25/15 0328  WBC 19.7* 11.9* 7.8  HGB 9.8* 9.0* 9.2*  HCT 30.1* 28.2* 27.9*  PLT 75* 71* 78*   BMET  Recent Labs  08/23/15 0554 08/24/15 0310 08/25/15 0328  NA 143 144 143  K 3.6 3.5 3.6  CL 116* 120* 116*  CO2 21* 20* 23  GLUCOSE 109* 91 75  BUN 18 13 10   CREATININE 0.92 0.74 0.73  CALCIUM 7.7* 7.8* 7.9*   LFT  Recent Labs  08/22/15 1946 08/23/15 0554  PROT 5.9* 5.4*  ALBUMIN 2.4* 2.2*  AST 67* 62*  ALT 32 28  ALKPHOS 79 66  BILITOT 0.8 0.8  BILIDIR 0.2  --   IBILI 0.6  --    PT/INR  Recent Labs  08/22/15 1310  LABPROT 17.8*  INR 1.46   Dg Chest Port 1 View  08/24/2015  CLINICAL DATA:  Shortness of breath and cough for 1 day EXAM: PORTABLE CHEST 1 VIEW COMPARISON:  August 23, 2015 FINDINGS: There is bibasilar interstitial edema with mild patchy alveolar opacity in the bases. Lungs elsewhere clear. Heart is mildly enlarged with pulmonary vascularity within normal  limits. No adenopathy. Patient is status post coronary artery bypass grafting. Stimulator leads are noted in the mid thoracic region, stable. IMPRESSION: Bibasilar interstitial edema with patchy airspace opacity. Suspect a degree of congestive heart failure, although pneumonia or aspiration could present similarly. More than one of these entities may exist concurrently. Stable cardiac prominence. Electronically Signed   By: Lowella Grip III M.D.   On: 08/24/2015 10:20   Dg Chest Port 1 View  08/23/2015  CLINICAL DATA:  Patient with shortness of breath.  Vomiting. EXAM: PORTABLE CHEST 1 VIEW COMPARISON:  Chest radiograph 08/22/2015 FINDINGS: Stable enlarged cardiac and mediastinal contours status post median sternotomy and CABG procedure. Unchanged basilar heterogeneous opacities. No pleural effusion or pneumothorax. Osseous skeleton is unremarkable. IMPRESSION: Unchanged basilar heterogeneous opacities which may represent atelectasis, aspiration or infection. Electronically Signed   By: Lovey Newcomer M.D.   On: 08/23/2015 12:23   Assessment / Plan: -GI bleeding in the setting of Xarelto anticoagulation: Sounds hemorrhoidal. Low-grade bleeding that already has seemed to be resolved. Hgb stable/at baseline. Recent EGD and colonoscopy with only diverticulosis noted. -Sepsis: Secondary to urinary source. On antibiotics. -Atrial fibrillation: Xarelto on hold due to GI bleeding.  -Will continue Miralax daily to keep bowels soft. -Treat empirically with hydrocortisone suppositories for now.  -Monitor Hgb. -Ok to restart Xarelto from GI standpoint if/when needed by cardiology.  LOS: 3 days   Zykeriah Mathia D.  08/25/2015, 10:17 AM  Pager number [phone]

## 2015-08-25 NOTE — Progress Notes (Signed)
PROGRESS NOTE  Nicole Randall [id number] DOB: 08-15-1945 DOA: 08/22/2015 PCP: Gildardo Cranker, DO  Brief History 70 year old female with a history of persistent atrial fibrillation on Xarelto, systolic CHF, hypertension, coronary artery disease, peripheral vascular disease, hyperlipidemia, anxiety presented with intractable nausea and vomiting on the morning of admission. The patient woke up having numerous episodes of nausea and vomiting approximately 4:30 AM on 08/22/2015. It resolved for short period of time, but around 7:30 AM, the patient began having 3-4 episodes of emesis again. There was no hematemesis or coffee grounds material. Notably, the patient was recently discharged from the hospital after a stay between 07/04/2015 through 07/09/2015 for upper GI bleed And acute blood loss anemia. Colonoscopy on 07/09/2015 revealed diverticulosis and EGD on 07/06/2015 suggested gastroparesis. The patient's rivaroxaban was restarted and aspirin was discontinued at the time of that discharge.  In the emergency department, the patient was noted to have WBC 22.7, platelets 94,000 with fever 101.69F. The patienthad soft blood pressures with systolic blood pressures in the 90s. The patient was given 30 mL/kg IV fluid bolus and started on Zosyn. Lactic acid was 2.88. Serum creatinine was 1.12. Urinalysis showed TNTC WBC.the patient was aggressively fluid resuscitated. Unfortunately, the patient developed fluid overload and intravenous furosemide was started on 08/24/2015. In addition, the patient developed hematochezia on 08/23/2015. Rivaroxaban was discontinued on 08/23/15. Cardiology was consulted. Gastroenterology was also consulted.  Assessment/Plan: Sepsis -Secondary to genitourinary source -Continue cefepime pending culture data -Blood cultures 2 sets--neg to date -Urine culture--Ecoli -Chest x-ray--bibasilar atelectasis -Lactic acid 2.88 -Procalcitonin--12.72 -overall BP's  improved--the patient has low blood pressures in the early morning hours  UTI -cefepime 3/29-3/31 -ampicillin 3/31-4/1 -amoxil 4/1-->  Paroxysmal atrial fibrillation -3/30--Discontinue xarelto due to recurrent hematochezia -Patient was not deemed to be a good candidate for Watchman device due to her comorbidities -CHADS-VASc = 5 -reconsulted cardiology -08/25/15--restart rivaroxaban  Hematochezia -07/09/2015 colonoscopy--moderate diverticulosis -07/06/2015 EGD--no source of GI bleed -08/23/15--hematochezia -suspect diverticular bleed in setting of rivaroxaban -08/24/15--consult GI--no intervention for now  Intractable nausea and vomiting -CT abdomen and pelvis without contrast--monitor stool with mild wall thickening in the cecum and ascending colon--> Suspect this may be artifact as CT was without contrast and patient had colonoscopy approximately one month ago significant only for diverticulosis -Lipase 19 -resolved -Advanced diet  Acute on Chronic systolic CHF (HFrEF) -[id number] echo EF 35-40 percent -Clinically compensated at this time -Initially held lasix 20mg  daily and Aldactone 12.5 mg secondary to hypotension -restart lasix IVx 1 on 3/31  Severe malnutrition -Continue ensure  Thrombocytopenia -Suspect this is chronic -The patient had thrombocytopenia during her last admission -Check fibrinogen--378 -B12--963  Elevated troponin/CAD s/p CABG x 2  -No chest pain presently -Likely demand ischemia in the setting of sepsis -Continue Lipitor  Tobacco abuse -Still smoking. 4 cigarettes per day -Patient has nearly 100-pack-year history -Tobacco cessation discussed  Hypokalemia/hypomagnesemia -replete  GOC -long discussion with daughter and pt-->continue present tx, but make DNR  Family Communication: Daughter updated at beside--total time 40 min; >50% spent counseling and coordinating care Disposition Plan: home 4/3 if stable; transfer to  floor  Procedures/Studies: Ct Abdomen Pelvis W Contrast  08/22/2015  CLINICAL DATA:  Intractable nausea and vomiting. Leukocytosis. Fever. EXAM: CT ABDOMEN AND PELVIS WITH CONTRAST TECHNIQUE: Multidetector CT imaging of the abdomen and pelvis was performed using the standard protocol following bolus administration of intravenous contrast. CONTRAST:  24mL ISOVUE-300 IOPAMIDOL (ISOVUE-300) INJECTION 61% COMPARISON:  None. FINDINGS: Lower  chest: There is prominent patchy consolidation in the dependent basilar right lower lobe. Mild patchy ground-glass opacity in the peripheral left lower lobe. Visualized sternotomy wire is intact. Myocardial calcifications at the left ventricular apex from prior myocardial infarction. Coronary atherosclerosis. Hepatobiliary: Normal liver with no liver mass. Cholecystectomy. Bile ducts are within expected post cholecystectomy limits with common bile duct diameter 5 mm. Pancreas: Atrophic appearing pancreas with no pancreatic mass or pancreatic duct dilation. Spleen: Normal size. No mass. Adrenals/Urinary Tract: Normal adrenals. No hydronephrosis. No renal mass. Normal bladder. There is crescentic high density material surrounding the urethra, possibly representing post treatment changes, correlate with surgical history. Stomach/Bowel: Grossly normal stomach. Normal caliber small bowel with no small bowel wall thickening. Status post appendectomy . Oral contrast progresses to the rectum. Moderate stool throughout the colon and rectum. There is a suggestion of mild wall thickening in the cecum, ascending colon and hepatic flexure of the colon with associated mild pericolonic fat stranding. Vascular/Lymphatic: Markedly atherosclerotic nonaneurysmal abdominal aorta. Patent portal, splenic, hepatic and renal veins. Large left splenorenal shunt. No pathologically enlarged lymph nodes in the abdomen or pelvis. Reproductive: Status post hysterectomy, with no abnormal findings at the vaginal  cuff. No adnexal mass. Other: No pneumoperitoneum, ascites or focal fluid collection. Musculoskeletal: No aggressive appearing focal osseous lesions. Patient is status post bilateral posterior L5-S1 fusion, with no evidence of hardware fracture or loosening. Moderate degenerative changes in the visualized thoracolumbar spine. Left back subcutaneous spinal stimulator demonstrates 2 leads coursing into the thoracic spinal canal, with the tips not seen on this study. IMPRESSION: 1. Patchy basilar right lower lobe consolidation and mild patchy left lower lobe ground-glass opacities, favor multifocal pneumonia and/or aspiration. 2. Mild colonic wall thickening and pericolonic fat stranding in the proximal colon, suggesting a nonspecific mild infectious or inflammatory colitis, with the differential including C. diff colitis. No evidence of bowel obstruction. 3. Large left splenorenal shunt of uncertain etiology. Portal, hepatic, splenic and renal veins appear patent. No macroscopic evidence of cirrhosis. No ascites. 4. Coronary atherosclerosis and remote myocardial infarct at the left ventricular apex. 5. Crescentic high density material surrounding the urethra, correlate with surgical history. Electronically Signed   By: Ilona Sorrel M.D.   On: 08/22/2015 16:25   Dg Chest Port 1 View  08/24/2015  CLINICAL DATA:  Shortness of breath and cough for 1 day EXAM: PORTABLE CHEST 1 VIEW COMPARISON:  August 23, 2015 FINDINGS: There is bibasilar interstitial edema with mild patchy alveolar opacity in the bases. Lungs elsewhere clear. Heart is mildly enlarged with pulmonary vascularity within normal limits. No adenopathy. Patient is status post coronary artery bypass grafting. Stimulator leads are noted in the mid thoracic region, stable. IMPRESSION: Bibasilar interstitial edema with patchy airspace opacity. Suspect a degree of congestive heart failure, although pneumonia or aspiration could present similarly. More than one of  these entities may exist concurrently. Stable cardiac prominence. Electronically Signed   By: Lowella Grip III M.D.   On: 08/24/2015 10:20   Dg Chest Port 1 View  08/23/2015  CLINICAL DATA:  Patient with shortness of breath.  Vomiting. EXAM: PORTABLE CHEST 1 VIEW COMPARISON:  Chest radiograph 08/22/2015 FINDINGS: Stable enlarged cardiac and mediastinal contours status post median sternotomy and CABG procedure. Unchanged basilar heterogeneous opacities. No pleural effusion or pneumothorax. Osseous skeleton is unremarkable. IMPRESSION: Unchanged basilar heterogeneous opacities which may represent atelectasis, aspiration or infection. Electronically Signed   By: Lovey Newcomer M.D.   On: 08/23/2015 12:23   Dg Chest  Port 1 View  08/22/2015  CLINICAL DATA:  70 year old female with sepsis. EXAM: PORTABLE CHEST 1 VIEW COMPARISON:  None. FINDINGS: Single-view of the chest demonstrates bibasilar hazy densities which may be atelectatic changes or represent pneumonia. There is no focal consolidation or pleural effusion. Linear density along the lateral aspect of the right lung most likely or skin fold. A small pneumothorax is much less likely. Repeat radiograph recommended if there is clinical concern for pneumothorax. There is mild cardiomegaly. Median sternotomy wires and CABG vascular clips noted. Spinal stimulator wires noted. No acute fracture. IMPRESSION: Bibasilar atelectasis/infiltrate. Skin fold artifact versus less likely a small right pneumothorax. Repeat radiograph is recommended if there is high clinical concern for pneumothorax. Electronically Signed   By: Anner Crete M.D.   On: 08/22/2015 20:32         Subjective:   Objective: Filed Vitals:   08/25/15 0600 08/25/15 0800 08/25/15 1200 08/25/15 1244  BP: 102/37     Pulse:      Temp:  98.1 F (36.7 C) 98.4 F (36.9 C)   TempSrc:  Oral Oral   Resp: 23     Height:      Weight:    55.8 kg (123 lb 0.3 oz)  SpO2:         Intake/Output Summary (Last 24 hours) at 08/25/15 1731 Last data filed at 08/25/15 1519  Gross per 24 hour  Intake    203 ml  Output   2775 ml  Net  -2572 ml   Weight change:  Exam:   General:  Pt is alert, follows commands appropriately, not in acute distress  HEENT: No icterus, No thrush, No neck mass, Lane/AT  Cardiovascular: RRR, S1/S2, no rubs, no gallops  Respiratory: CTA bilaterally, no wheezing, no crackles, no rhonchi  Abdomen: Soft/+BS, non tender, non distended, no guarding  Extremities: No edema, No lymphangitis, No petechiae, No rashes, no synovitis  Data Reviewed: Basic Metabolic Panel:  Recent Labs Lab 08/22/15 1310 08/23/15 0554 08/24/15 0310 08/25/15 0328  NA 140 143 144 143  K 4.3 3.6 3.5 3.6  CL 104 116* 120* 116*  CO2 26 21* 20* 23  GLUCOSE 109* 109* 91 75  BUN 19 18 13 10   CREATININE 1.12* 0.92 0.74 0.73  CALCIUM 9.5 7.7* 7.8* 7.9*  MG  --   --  1.3* 1.6*   Liver Function Tests:  Recent Labs Lab 08/22/15 1310 08/22/15 1946 08/23/15 0554  AST 49* 67* 62*  ALT 26 32 28  ALKPHOS 93 79 66  BILITOT 1.1 0.8 0.8  PROT 7.5 5.9* 5.4*  ALBUMIN 3.2* 2.4* 2.2*    Recent Labs Lab 08/22/15 1310  LIPASE 19   No results for input(s): AMMONIA in the last 168 hours. CBC:  Recent Labs Lab 08/22/15 1310 08/23/15 0554 08/24/15 0310 08/25/15 0328  WBC 22.7* 19.7* 11.9* 7.8  NEUTROABS 20.9*  --   --   --   HGB 12.4 9.8* 9.0* 9.2*  HCT 37.3 30.1* 28.2* 27.9*  MCV 89.7 90.9 91.0 88.3  PLT 94* 75* 71* 78*   Cardiac Enzymes:  Recent Labs Lab 08/22/15 1310  TROPONINI 0.10*   BNP: Invalid input(s): POCBNP CBG: No results for input(s): GLUCAP in the last 168 hours.  Recent Results (from the past 240 hour(s))  Culture, blood (routine x 2)     Status: None (Preliminary result)   Collection Time: 08/22/15  1:19 PM  Result Value Ref Range Status   Specimen Description BLOOD RIGHT  ANTECUBITAL  Final   Special Requests BOTTLES DRAWN  AEROBIC AND ANAEROBIC 5ML  Final   Culture   Final    NO GROWTH 3 DAYS Performed at [facility]    Report Status PENDING  Incomplete  Culture, blood (routine x 2)     Status: None (Preliminary result)   Collection Time: 08/22/15  1:21 PM  Result Value Ref Range Status   Specimen Description BLOOD LEFT FOREARM  Final   Special Requests BOTTLES DRAWN AEROBIC AND ANAEROBIC 5ML  Final   Culture   Final    NO GROWTH 3 DAYS Performed at [facility]    Report Status PENDING  Incomplete  Urine culture     Status: None   Collection Time: 08/22/15  1:34 PM  Result Value Ref Range Status   Specimen Description URINE, RANDOM  Final   Special Requests NONE  Final   Culture   Final    >=100,000 COLONIES/mL ESCHERICHIA COLI Performed at [facility]    Report Status 08/24/2015 FINAL  Final   Organism ID, Bacteria ESCHERICHIA COLI  Final      Susceptibility   Escherichia coli - MIC*    AMPICILLIN <=2 SENSITIVE Sensitive     CEFAZOLIN <=4 SENSITIVE Sensitive     CEFTRIAXONE <=1 SENSITIVE Sensitive     CIPROFLOXACIN <=0.25 SENSITIVE Sensitive     GENTAMICIN <=1 SENSITIVE Sensitive     IMIPENEM <=0.25 SENSITIVE Sensitive     NITROFURANTOIN <=16 SENSITIVE Sensitive     TRIMETH/SULFA <=20 SENSITIVE Sensitive     AMPICILLIN/SULBACTAM <=2 SENSITIVE Sensitive     PIP/TAZO <=4 SENSITIVE Sensitive     * >=100,000 COLONIES/mL ESCHERICHIA COLI  MRSA PCR Screening     Status: None   Collection Time: 08/22/15  7:35 PM  Result Value Ref Range Status   MRSA by PCR NEGATIVE NEGATIVE Final    Comment:        The GeneXpert MRSA Assay (FDA approved for NASAL specimens only), is one component of a comprehensive MRSA colonization surveillance program. It is not intended to diagnose MRSA infection nor to guide or monitor treatment for MRSA infections.      Scheduled Meds: . amiodarone  200 mg Oral Daily  . ampicillin (OMNIPEN) IV  2 g Intravenous 4 times per day  .  atorvastatin  80 mg Oral q1800  . busPIRone  7.5 mg Oral BID  . feeding supplement (ENSURE ENLIVE)  237 mL Oral BID BM  . ferrous sulfate  325 mg Oral BID WC  . gabapentin  300 mg Oral BID  . Glycerin (Adult)  1 suppository Rectal Once  . hydrocortisone  25 mg Rectal BID  . magnesium sulfate 1 - 4 g bolus IVPB  4 g Intravenous Once  . midodrine  5 mg Oral TID WC  . oxyCODONE-acetaminophen  1 tablet Oral TID   And  . oxyCODONE  5 mg Oral TID  . polyethylene glycol  17 g Oral Daily  . rivaroxaban  15 mg Oral Q supper  . sodium chloride flush  3 mL Intravenous Q12H  . traZODone  50 mg Oral QHS   Continuous Infusions:    Taylynn Easton, DO  [facility] Pager [phone]  If 7PM-7AM, please contact night-coverage [email or web address] Password [email or web address] 08/25/2015, 5:31 PM   LOS: 3 days

## 2015-08-26 DIAGNOSIS — I5022 Chronic systolic (congestive) heart failure: Secondary | ICD-10-CM

## 2015-08-26 LAB — CBC
HEMATOCRIT: 33.2 % — AB (ref 36.0–46.0)
Hemoglobin: 10.9 g/dL — ABNORMAL LOW (ref 12.0–15.0)
MCH: 28.9 pg (ref 26.0–34.0)
MCHC: 32.8 g/dL (ref 30.0–36.0)
MCV: 88.1 fL (ref 78.0–100.0)
PLATELETS: 90 10*3/uL — AB (ref 150–400)
RBC: 3.77 MIL/uL — ABNORMAL LOW (ref 3.87–5.11)
RDW: 23 % — ABNORMAL HIGH (ref 11.5–15.5)
WBC: 8.3 10*3/uL (ref 4.0–10.5)

## 2015-08-26 LAB — BASIC METABOLIC PANEL
ANION GAP: 7 (ref 5–15)
BUN: 6 mg/dL (ref 6–20)
CHLORIDE: 109 mmol/L (ref 101–111)
CO2: 24 mmol/L (ref 22–32)
Calcium: 8.1 mg/dL — ABNORMAL LOW (ref 8.9–10.3)
Creatinine, Ser: 0.63 mg/dL (ref 0.44–1.00)
Glucose, Bld: 118 mg/dL — ABNORMAL HIGH (ref 65–99)
POTASSIUM: 4 mmol/L (ref 3.5–5.1)
SODIUM: 140 mmol/L (ref 135–145)

## 2015-08-26 LAB — MAGNESIUM: Magnesium: 2 mg/dL (ref 1.7–2.4)

## 2015-08-26 MED ORDER — SPIRONOLACTONE 12.5 MG HALF TABLET
12.5000 mg | ORAL_TABLET | Freq: Every day | ORAL | Status: DC
  Administered 2015-08-26 – 2015-08-28 (×3): 12.5 mg via ORAL
  Filled 2015-08-26 (×3): qty 1

## 2015-08-26 MED ORDER — AMIODARONE HCL 200 MG PO TABS
400.0000 mg | ORAL_TABLET | Freq: Every day | ORAL | Status: DC
Start: 2015-08-26 — End: 2015-08-28
  Administered 2015-08-26 – 2015-08-28 (×3): 400 mg via ORAL
  Filled 2015-08-26 (×3): qty 2
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
## 2015-08-26 NOTE — Progress Notes (Signed)
   Subjective: Pt resting comfortably  No CP  Breathing is OK   Objective: Filed Vitals:   08/25/15 1700 08/25/15 1800 08/25/15 2056 08/26/15 0441  BP: 175/75 99/55 139/54 109/49  Pulse: 107 81 71 67  Temp:   98.1 F (36.7 C) 98.1 F (36.7 C)  TempSrc:   Oral Oral  Resp: 16 22 20 16   Height:      Weight:      SpO2: 97% 94% 98% 98%   Weight change:   Intake/Output Summary (Last 24 hours) at 08/26/15 0723 Last data filed at 08/26/15 0600  Gross per 24 hour  Intake   1643 ml  Output   3425 ml  Net  -1782 ml   I/O -3.45 L    General: Alert, awake, oriented x3, in no acute distress Neck:  JVP is normal Heart: Regular rate and rhythm, without murmurs, rubs, gallops.  Lungs: Clear to auscultation.  No rales or wheezes. Exemities:  No edema.   Neuro: Grossly intact, nonfocal.  Tele:  SR with PAF    Lab Results: Results for orders placed or performed during the hospital encounter of 08/22/15 (from the past 24 hour(s))  Basic metabolic panel     Status: Abnormal   Collection Time: 08/26/15  5:22 AM  Result Value Ref Range   Sodium 140 135 - 145 mmol/L   Potassium 4.0 3.5 - 5.1 mmol/L   Chloride 109 101 - 111 mmol/L   CO2 24 22 - 32 mmol/L   Glucose, Bld 118 (H) 65 - 99 mg/dL   BUN 6 6 - 20 mg/dL   Creatinine, Ser 0.63 0.44 - 1.00 mg/dL   Calcium 8.1 (L) 8.9 - 10.3 mg/dL   GFR calc non Af Amer >60 >60 mL/min   GFR calc Af Amer >60 >60 mL/min   Anion gap 7 5 - 15  CBC     Status: Abnormal   Collection Time: 08/26/15  5:22 AM  Result Value Ref Range   WBC 8.3 4.0 - 10.5 K/uL   RBC 3.77 (L) 3.87 - 5.11 MIL/uL   Hemoglobin 10.9 (L) 12.0 - 15.0 g/dL   HCT 33.2 (L) 36.0 - 46.0 %   MCV 88.1 78.0 - 100.0 fL   MCH 28.9 26.0 - 34.0 pg   MCHC 32.8 30.0 - 36.0 g/dL   RDW 23.0 (H) 11.5 - 15.5 %   Platelets 90 (L) 150 - 400 K/uL  Magnesium     Status: None   Collection Time: 08/26/15  5:22 AM  Result Value Ref Range   Magnesium 2.0 1.7 - 2.4 mg/dL    Studies/Results: No  results found.  Medications: REviewed   @PROBHOSP @  1  Rhythm  Intermitt PAF On amio   I would recomm increasing dose to 400  Daily  She can then f/u as outpt.  Xarelto on hold  No further bleeding  Hgb stable Back on Xarelto  2    Acute on chronic systolic CHF  Volume status is OK   Sge was on aldcatone prior to admit  This is still on hold   3  CAD  Remote CABG  No active ischemia  ON statin  4  Hypotension  Pt was on midodrine pre admit  Need to get pt up  Ambulate some then cehck orthostatics    LOS: 4 days   Nicole Randall 08/26/2015, 7:23 AM

## 2015-08-26 NOTE — Progress Notes (Signed)
PROGRESS NOTE  Nicole Randall [id number] DOB: [birth year] DOA: 08/22/2015 PCP: Gildardo Cranker, DO  Brief History 70 year old female with a history of persistent atrial fibrillation on Xarelto, systolic CHF, hypertension, coronary artery disease, peripheral vascular disease, hyperlipidemia, anxiety presented with intractable nausea and vomiting on the morning of admission. The patient woke up having numerous episodes of nausea and vomiting approximately 4:30 AM on 08/22/2015. It resolved for short period of time, but around 7:30 AM, the patient began having 3-4 episodes of emesis again. There was no hematemesis or coffee grounds material. Notably, the patient was recently discharged from the hospital after a stay between 07/04/2015 through 07/09/2015 for upper GI bleed And acute blood loss anemia. Colonoscopy on 07/09/2015 revealed diverticulosis and EGD on 07/06/2015 suggested gastroparesis. The patient's rivaroxaban was restarted and aspirin was discontinued at the time of that discharge.  In the emergency department, the patient was noted to have WBC 22.7, platelets 94,000 with fever 101.70F. The patienthad soft blood pressures with systolic blood pressures in the 90s. The patient was given 30 mL/kg IV fluid bolus and started on Zosyn. Lactic acid was 2.88. Serum creatinine was 1.12. Urinalysis showed TNTC WBC.the patient was aggressively fluid resuscitated. Unfortunately, the patient developed fluid overload and intravenous furosemide was started on 08/24/2015. In addition, the patient developed hematochezia on 08/23/2015. Rivaroxaban was discontinued on 08/23/15. Cardiology was consulted. Gastroenterology was also consulted.  Assessment/Plan: Sepsis -Secondary to genitourinary source -Continue cefepime pending culture data -Blood cultures 2 sets--neg to date -Urine culture--Ecoli -Chest x-ray--bibasilar atelectasis -Lactic acid 2.88 -Procalcitonin--12.72 -overall BP's  improved--the patient has low blood pressures in the early morning hours  UTI--E.Coli -cefepime 3/29-3/31 -ampicillin 3/31-4/1 -amoxil 4/1-->  Paroxysmal atrial fibrillation -3/30--Discontinued xarelto due to recurrent hematochezia -Patient was not deemed to be a good candidate for Watchman device due to her comorbidities -CHADS-VASc = 5 -reconsulted cardiology -08/25/15--restarted rivaroxaban-->hgb stable  Hematochezia -07/09/2015 colonoscopy--moderate diverticulosis -07/06/2015 EGD--no source of GI bleed -08/23/15--hematochezia -suspect diverticular bleed in setting of rivaroxaban -08/24/15--consult GI--no intervention for now  Intractable nausea and vomiting -CT abdomen and pelvis without contrast--monitor stool with mild wall thickening in the cecum and ascending colon--> Suspect this may be artifact as CT was without contrast and patient had colonoscopy approximately one month ago significant only for diverticulosis -Lipase 19 -resolved -Advanced diet-->tolerating although had an episode of N/V after ensure today  Acute on Chronic systolic CHF (HFrEF) -[id number] echo EF 35-40 percent -Clinically compensated at this time -Initially held lasix 20mg  daily and Aldactone 12.5 mg secondary to hypotension -restart lasix IVx 1 on 3/31 -plan to restart po lasix 4/3 if stable  Severe malnutrition -Continue ensure  Thrombocytopenia -Suspect this is chronic -The patient had thrombocytopenia during her last admission -Check fibrinogen--378 -B12--963  Elevated troponin/CAD s/p CABG x 2  -No chest pain presently -Likely demand ischemia in the setting of sepsis -Continue Lipitor  Tobacco abuse -Still smoking. 4 cigarettes per day -Patient has nearly 100-pack-year history -Tobacco cessation discussed  Hypokalemia/hypomagnesemia -replete  GOC -08/25/15--long discussion with daughter and pt-->continue present tx, but make DNR -4/2--consulted palliative medicine per family  request  Deconditioning -PT eval -may need SNF temporarily  Family Communication: Daughter updated at beside 4/2 Disposition Plan:[facility] or SNF 4/3 if stable   Procedures/Studies: Ct Abdomen Pelvis W Contrast  08/22/2015  CLINICAL DATA:  Intractable nausea and vomiting. Leukocytosis. Fever. EXAM: CT ABDOMEN AND PELVIS WITH CONTRAST TECHNIQUE: Multidetector CT imaging of the abdomen and pelvis was performed  using the standard protocol following bolus administration of intravenous contrast. CONTRAST:  48mL ISOVUE-300 IOPAMIDOL (ISOVUE-300) INJECTION 61% COMPARISON:  None. FINDINGS: Lower chest: There is prominent patchy consolidation in the dependent basilar right lower lobe. Mild patchy ground-glass opacity in the peripheral left lower lobe. Visualized sternotomy wire is intact. Myocardial calcifications at the left ventricular apex from prior myocardial infarction. Coronary atherosclerosis. Hepatobiliary: Normal liver with no liver mass. Cholecystectomy. Bile ducts are within expected post cholecystectomy limits with common bile duct diameter 5 mm. Pancreas: Atrophic appearing pancreas with no pancreatic mass or pancreatic duct dilation. Spleen: Normal size. No mass. Adrenals/Urinary Tract: Normal adrenals. No hydronephrosis. No renal mass. Normal bladder. There is crescentic high density material surrounding the urethra, possibly representing post treatment changes, correlate with surgical history. Stomach/Bowel: Grossly normal stomach. Normal caliber small bowel with no small bowel wall thickening. Status post appendectomy . Oral contrast progresses to the rectum. Moderate stool throughout the colon and rectum. There is a suggestion of mild wall thickening in the cecum, ascending colon and hepatic flexure of the colon with associated mild pericolonic fat stranding. Vascular/Lymphatic: Markedly atherosclerotic nonaneurysmal abdominal aorta. Patent portal, splenic, hepatic and renal veins. Large  left splenorenal shunt. No pathologically enlarged lymph nodes in the abdomen or pelvis. Reproductive: Status post hysterectomy, with no abnormal findings at the vaginal cuff. No adnexal mass. Other: No pneumoperitoneum, ascites or focal fluid collection. Musculoskeletal: No aggressive appearing focal osseous lesions. Patient is status post bilateral posterior L5-S1 fusion, with no evidence of hardware fracture or loosening. Moderate degenerative changes in the visualized thoracolumbar spine. Left back subcutaneous spinal stimulator demonstrates 2 leads coursing into the thoracic spinal canal, with the tips not seen on this study. IMPRESSION: 1. Patchy basilar right lower lobe consolidation and mild patchy left lower lobe ground-glass opacities, favor multifocal pneumonia and/or aspiration. 2. Mild colonic wall thickening and pericolonic fat stranding in the proximal colon, suggesting a nonspecific mild infectious or inflammatory colitis, with the differential including C. diff colitis. No evidence of bowel obstruction. 3. Large left splenorenal shunt of uncertain etiology. Portal, hepatic, splenic and renal veins appear patent. No macroscopic evidence of cirrhosis. No ascites. 4. Coronary atherosclerosis and remote myocardial infarct at the left ventricular apex. 5. Crescentic high density material surrounding the urethra, correlate with surgical history. Electronically Signed   By: Ilona Sorrel M.D.   On: 08/22/2015 16:25   Dg Chest Port 1 View  08/24/2015  CLINICAL DATA:  Shortness of breath and cough for 1 day EXAM: PORTABLE CHEST 1 VIEW COMPARISON:  August 23, 2015 FINDINGS: There is bibasilar interstitial edema with mild patchy alveolar opacity in the bases. Lungs elsewhere clear. Heart is mildly enlarged with pulmonary vascularity within normal limits. No adenopathy. Patient is status post coronary artery bypass grafting. Stimulator leads are noted in the mid thoracic region, stable. IMPRESSION: Bibasilar  interstitial edema with patchy airspace opacity. Suspect a degree of congestive heart failure, although pneumonia or aspiration could present similarly. More than one of these entities may exist concurrently. Stable cardiac prominence. Electronically Signed   By: Lowella Grip III M.D.   On: 08/24/2015 10:20   Dg Chest Port 1 View  08/23/2015  CLINICAL DATA:  Patient with shortness of breath.  Vomiting. EXAM: PORTABLE CHEST 1 VIEW COMPARISON:  Chest radiograph 08/22/2015 FINDINGS: Stable enlarged cardiac and mediastinal contours status post median sternotomy and CABG procedure. Unchanged basilar heterogeneous opacities. No pleural effusion or pneumothorax. Osseous skeleton is unremarkable. IMPRESSION: Unchanged basilar heterogeneous opacities which may  represent atelectasis, aspiration or infection. Electronically Signed   By: Lovey Newcomer M.D.   On: 08/23/2015 12:23   Dg Chest Port 1 View  08/22/2015  CLINICAL DATA:  70 year old female with sepsis. EXAM: PORTABLE CHEST 1 VIEW COMPARISON:  None. FINDINGS: Single-view of the chest demonstrates bibasilar hazy densities which may be atelectatic changes or represent pneumonia. There is no focal consolidation or pleural effusion. Linear density along the lateral aspect of the right lung most likely or skin fold. A small pneumothorax is much less likely. Repeat radiograph recommended if there is clinical concern for pneumothorax. There is mild cardiomegaly. Median sternotomy wires and CABG vascular clips noted. Spinal stimulator wires noted. No acute fracture. IMPRESSION: Bibasilar atelectasis/infiltrate. Skin fold artifact versus less likely a small right pneumothorax. Repeat radiograph is recommended if there is high clinical concern for pneumothorax. Electronically Signed   By: Anner Crete M.D.   On: 08/22/2015 20:32         Subjective: Patient denies fevers, chills, headache, chest pain, dyspnea, nausea, vomiting, diarrhea, abdominal pain,  dysuria, hematuria   Objective: Filed Vitals:   08/25/15 1800 08/25/15 2056 08/26/15 0441 08/26/15 1415  BP: 99/55 139/54 109/49 121/58  Pulse: 81 71 67 75  Temp:  98.1 F (36.7 C) 98.1 F (36.7 C) 97.9 F (36.6 C)  TempSrc:  Oral Oral Oral  Resp: 22 20 16 18   Height:      Weight:      SpO2: 94% 98% 98% 98%    Intake/Output Summary (Last 24 hours) at 08/26/15 1723 Last data filed at 08/26/15 1606  Gross per 24 hour  Intake    890 ml  Output   2650 ml  Net  -1760 ml   Weight change:  Exam:   General:  Pt is alert, follows commands appropriately, not in acute distress  HEENT: No icterus, No thrush, No neck mass, [place]/AT  Cardiovascular: RRR, S1/S2, no rubs, no gallops  Respiratory: bibasilar crackles. No wheezing.  Abdomen: Soft/+BS, non tender, non distended, no guarding; no hepatosplenomegaly  Extremities: No edema, No lymphangitis, No petechiae, No rashes, no synovitis  Data Reviewed: Basic Metabolic Panel:  Recent Labs Lab 08/22/15 1310 08/23/15 0554 08/24/15 0310 08/25/15 0328 08/26/15 0522  NA 140 143 144 143 140  K 4.3 3.6 3.5 3.6 4.0  CL 104 116* 120* 116* 109  CO2 26 21* 20* 23 24  GLUCOSE 109* 109* 91 75 118*  BUN 19 18 13 10 6   CREATININE 1.12* 0.92 0.74 0.73 0.63  CALCIUM 9.5 7.7* 7.8* 7.9* 8.1*  MG  --   --  1.3* 1.6* 2.0   Liver Function Tests:  Recent Labs Lab 08/22/15 1310 08/22/15 1946 08/23/15 0554  AST 49* 67* 62*  ALT 26 32 28  ALKPHOS 93 79 66  BILITOT 1.1 0.8 0.8  PROT 7.5 5.9* 5.4*  ALBUMIN 3.2* 2.4* 2.2*    Recent Labs Lab 08/22/15 1310  LIPASE 19   No results for input(s): AMMONIA in the last 168 hours. CBC:  Recent Labs Lab 08/22/15 1310 08/23/15 0554 08/24/15 0310 08/25/15 0328 08/26/15 0522  WBC 22.7* 19.7* 11.9* 7.8 8.3  NEUTROABS 20.9*  --   --   --   --   HGB 12.4 9.8* 9.0* 9.2* 10.9*  HCT 37.3 30.1* 28.2* 27.9* 33.2*  MCV 89.7 90.9 91.0 88.3 88.1  PLT 94* 75* 71* 78* 90*   Cardiac  Enzymes:  Recent Labs Lab 08/22/15 1310  TROPONINI 0.10*   BNP:  Invalid input(s): POCBNP CBG: No results for input(s): GLUCAP in the last 168 hours.  Recent Results (from the past 240 hour(s))  Culture, blood (routine x 2)     Status: None (Preliminary result)   Collection Time: 08/22/15  1:19 PM  Result Value Ref Range Status   Specimen Description BLOOD RIGHT ANTECUBITAL  Final   Special Requests BOTTLES DRAWN AEROBIC AND ANAEROBIC 5ML  Final   Culture   Final    NO GROWTH 4 DAYS Performed at [facility]    Report Status PENDING  Incomplete  Culture, blood (routine x 2)     Status: None (Preliminary result)   Collection Time: 08/22/15  1:21 PM  Result Value Ref Range Status   Specimen Description BLOOD LEFT FOREARM  Final   Special Requests BOTTLES DRAWN AEROBIC AND ANAEROBIC 5ML  Final   Culture   Final    NO GROWTH 4 DAYS Performed at [facility]    Report Status PENDING  Incomplete  Urine culture     Status: None   Collection Time: 08/22/15  1:34 PM  Result Value Ref Range Status   Specimen Description URINE, RANDOM  Final   Special Requests NONE  Final   Culture   Final    >=100,000 COLONIES/mL ESCHERICHIA COLI Performed at [facility]    Report Status 08/24/2015 FINAL  Final   Organism ID, Bacteria ESCHERICHIA COLI  Final      Susceptibility   Escherichia coli - MIC*    AMPICILLIN <=2 SENSITIVE Sensitive     CEFAZOLIN <=4 SENSITIVE Sensitive     CEFTRIAXONE <=1 SENSITIVE Sensitive     CIPROFLOXACIN <=0.25 SENSITIVE Sensitive     GENTAMICIN <=1 SENSITIVE Sensitive     IMIPENEM <=0.25 SENSITIVE Sensitive     NITROFURANTOIN <=16 SENSITIVE Sensitive     TRIMETH/SULFA <=20 SENSITIVE Sensitive     AMPICILLIN/SULBACTAM <=2 SENSITIVE Sensitive     PIP/TAZO <=4 SENSITIVE Sensitive     * >=100,000 COLONIES/mL ESCHERICHIA COLI  MRSA PCR Screening     Status: None   Collection Time: 08/22/15  7:35 PM  Result Value Ref Range Status   MRSA  by PCR NEGATIVE NEGATIVE Final    Comment:        The GeneXpert MRSA Assay (FDA approved for NASAL specimens only), is one component of a comprehensive MRSA colonization surveillance program. It is not intended to diagnose MRSA infection nor to guide or monitor treatment for MRSA infections.      Scheduled Meds: . amiodarone  400 mg Oral Daily  . amoxicillin  500 mg Oral 3 times per day  . antiseptic oral rinse  7 mL Mouth Rinse q12n4p  . atorvastatin  80 mg Oral q1800  . busPIRone  7.5 mg Oral BID  . chlorhexidine  15 mL Mouth Rinse BID  . feeding supplement (ENSURE ENLIVE)  237 mL Oral BID BM  . ferrous sulfate  325 mg Oral BID WC  . gabapentin  300 mg Oral BID  . Glycerin (Adult)  1 suppository Rectal Once  . hydrocortisone  25 mg Rectal BID  . midodrine  5 mg Oral TID WC  . oxyCODONE-acetaminophen  1 tablet Oral TID   And  . oxyCODONE  5 mg Oral TID  . polyethylene glycol  17 g Oral Daily  . rivaroxaban  15 mg Oral Q supper  . sodium chloride flush  3 mL Intravenous Q12H  . spironolactone  12.5 mg Oral Daily  .  traZODone  50 mg Oral QHS   Continuous Infusions:    Tyshell Ramberg, DO  [facility] Pager [phone]  If 7PM-7AM, please contact night-coverage [email or web address] Password [email or web address] 08/26/2015, 5:23 PM   LOS: 4 days

## 2015-08-26 NOTE — Progress Notes (Signed)
Resumed care of patient. Agree with previous assessment. Orders reviewed and will continue to monitor.

## 2015-08-27 DIAGNOSIS — R531 Weakness: Secondary | ICD-10-CM

## 2015-08-27 DIAGNOSIS — G894 Chronic pain syndrome: Secondary | ICD-10-CM

## 2015-08-27 DIAGNOSIS — Z515 Encounter for palliative care: Secondary | ICD-10-CM

## 2015-08-27 DIAGNOSIS — Z66 Do not resuscitate: Secondary | ICD-10-CM | POA: Insufficient documentation

## 2015-08-27 LAB — CULTURE, BLOOD (ROUTINE X 2)
CULTURE: NO GROWTH
Culture: NO GROWTH

## 2015-08-27 LAB — BASIC METABOLIC PANEL
ANION GAP: 5 (ref 5–15)
BUN: 6 mg/dL (ref 6–20)
CALCIUM: 8.5 mg/dL — AB (ref 8.9–10.3)
CO2: 25 mmol/L (ref 22–32)
Chloride: 112 mmol/L — ABNORMAL HIGH (ref 101–111)
Creatinine, Ser: 0.72 mg/dL (ref 0.44–1.00)
GFR calc Af Amer: >60 mL/min (ref >60)
GFR calc non Af Amer: >60 mL/min (ref >60)
GLUCOSE: 114 mg/dL — AB (ref 65–99)
Potassium: 4.3 mmol/L (ref 3.5–5.1)
Sodium: 142 mmol/L (ref 135–145)

## 2015-08-27 LAB — MAGNESIUM: Magnesium: 1.7 mg/dL (ref 1.7–2.4)

## 2015-08-27 NOTE — Care Management Note (Signed)
Case Management Note  Patient Details  Name: Nikitta Wiedner MRN: [id number] Date of Birth: 03-24-1946  Subjective/Objective:69 y/o f admitted w/Sepsis. From ALF-[facility]-Active w/HHRN/HHPT-[facility]. Rep Karolee Stamps aware-awaiting HHRN/HHPT f85f order.                    Action/Plan:d/c plan ALF w/HHC   Expected Discharge Date:   (unknown)               Expected Discharge Plan:  Assisted Living / Rest Home  In-House Referral:  Clinical Social Work  Discharge planning Services  CM Consult  Post Acute Care Choice:  Home Health (Active w/[facility] HHRN/HHPT) Choice offered to:     DME Arranged:    DME Agency:     HH Arranged:    [place] Agency:     Status of Service:  In process, will continue to follow  Medicare Important Message Given:    Date Medicare IM Given:    Medicare IM give by:    Date Additional Medicare IM Given:    Additional Medicare Important Message give by:     If discussed at [place] of Stay Meetings, dates discussed:    Additional Comments:  Dessa Phi, RN 08/27/2015, 2:57 PM

## 2015-08-27 NOTE — Progress Notes (Signed)
Nutrition Follow-up  DOCUMENTATION CODES:   Severe malnutrition in context of acute illness/injury, Underweight  INTERVENTION:  -Continue Ensure Enlive BID. Each supplement provides 350 kcals and 20 grams of protein.  -RD will continue to monitor nutritional needs.   NUTRITION DIAGNOSIS:   Inadequate oral intake related to poor appetite as evidenced by per patient/family report.-ongoing  GOAL:   Patient will meet greater than or equal to 90% of their needs-unmet  MONITOR:   PO intake, Supplement acceptance, Diet advancement, Labs, Weight trends  REASON FOR ASSESSMENT:   Consult    ASSESSMENT:   70 year old female with a history of persistent Afib, systolic CHF, HTN, CAD, peripheral vascular disease, HLD, anxiety presented with intractable nausea and vomiting on the morning of admission. There was no hematemesis or coffee grounds material. The patient has subjective fevers and chills but denied any chest pain,SOB, coughing, hemoptysis, diarrhea, hematochezia, melena. Notably, the patient was recently discharged from the hospital after a stay between 07/04/2015 through 07/09/2015 for upper GI bleed And acute blood loss anemia. Colonoscopy on 07/09/2015 revealed diverticulosis and EGD on 07/06/2015 suggested gastroparesis.   4/3  Pt seen for consult and follow up visit. Pt continues to have inadequate oral intake. Pt reports poor appetite. She ate 2/3 of an omelette and an english muffin for breakfast. Pt states no N/V today after eating. Per chart, pt is eating poorly and eating between 25-100% of her meals. Pt reports vomiting 4/2 at 6 pm after drinking Ensure otherwise no N/V. Pt reports tolerating Ensure BID and does not wish to add any additional supplements. Pt indicates understanding of the importance of good PO intake at meals along with supplement intake. RD will continue to monitor supplement acceptance and PO intake at meals and snacks.   Meds reviewed; ferrous sulfate  325 mg, Miralax 17 g daily  Labs reviewed; Ca 8.5 mg/dl, Cl 112 mmol/L, Glucose 114 mg/dl  3/30  -Patient reports that she has had a decreased appetite for about 6 months now, stating that she is unsure why she has not been hungry.  -Patient says during this time she would have 1-2 small meals per day, often salads.  - Nutrition Focused Physical Exam was completed.  - Findings include moderate fat depletion, moderate muscle depletion, and no edema.  - Per chart review, patient has lost 13# / 11% body weight x 2 months, significant for the time frame.  - Patient classified as underweight with a BMI of 18.5 kg/(m^2).  - Patient not likely meeting needs and is amenable to continuing with Ensure BID.   Diet Order:  Diet Heart Room service appropriate?: Yes; Fluid consistency:: Thin  Skin:  Reviewed, no issues  Last BM:  3/31  Height:   Ht Readings from Last 1 Encounters:  08/22/15 5\' 3"  (1.6 m)    Weight:   Wt Readings from Last 1 Encounters:  08/25/15 123 lb 0.3 oz (55.8 kg)    Ideal Body Weight:  52.3 kg  BMI:  Body mass index is 21.8 kg/(m^2).  Estimated Nutritional Needs:   Kcal:  1300-1500  Protein:  55-65 grams  Fluid:  >/= 1.5 L  EDUCATION NEEDS:   No education needs identified at this time   Geoffery Lyons, [place] [facility] Intern Pager [phone]

## 2015-08-27 NOTE — Progress Notes (Signed)
PROGRESS NOTE  Nicole Randall [id number] DOB: Oct 17, 1945 DOA: 08/22/2015 PCP: Gildardo Cranker, DO   Brief History 70 year old female with a history of persistent atrial fibrillation on Xarelto, systolic CHF, hypertension, coronary artery disease, peripheral vascular disease, hyperlipidemia, anxiety presented with intractable nausea and vomiting on the morning of admission. The patient woke up having numerous episodes of nausea and vomiting approximately 4:30 AM on 08/22/2015. It resolved for short period of time, but around 7:30 AM, the patient began having 3-4 episodes of emesis again. There was no hematemesis or coffee grounds material. Notably, the patient was recently discharged from the hospital after a stay between 07/04/2015 through 07/09/2015 for upper GI bleed And acute blood loss anemia. Colonoscopy on 07/09/2015 revealed diverticulosis and EGD on 07/06/2015 suggested gastroparesis. The patient's rivaroxaban was restarted and aspirin was discontinued at the time of that discharge.  In the emergency department, the patient was noted to have WBC 22.7, platelets 94,000 with fever 101.4F. The patienthad soft blood pressures with systolic blood pressures in the 90s. The patient was given 30 mL/kg IV fluid bolus and started on Zosyn. Lactic acid was 2.88. Serum creatinine was 1.12. Urinalysis showed TNTC WBC.the patient was aggressively fluid resuscitated. Unfortunately, the patient developed fluid overload and intravenous furosemide was started on 08/24/2015. In addition, the patient developed hematochezia on 08/23/2015. Rivaroxaban was discontinued on 08/23/15. Cardiology was consulted. Gastroenterology was also consulted.  Assessment/Plan: Sepsis -Secondary to genitourinary source -Initially on cefepime pending culture data -Blood cultures 2 sets--neg to date -Urine culture--Ecoli -Chest x-ray--bibasilar atelectasis -Lactic acid 2.88 -Procalcitonin--12.72 -overall  BP's improved--the patient has low blood pressures in the early morning hours  UTI--E.Coli -cefepime 3/29-3/31 -ampicillin 3/31-4/1 -amoxil 4/1-->  Paroxysmal atrial fibrillation -3/30--Discontinued xarelto due to recurrent hematochezia -Patient was not deemed to be a good candidate for Watchman device due to her comorbidities -CHADS-VASc = 5 -reconsulted cardiology -08/25/15--restarted rivaroxaban-->hgb stable  Hematochezia -07/09/2015 colonoscopy--moderate diverticulosis -07/06/2015 EGD--no source of GI bleed -08/23/15--hematochezia -suspect diverticular bleed in setting of rivaroxaban -08/24/15--consult GI--no intervention for now -Hgb stable  Intractable nausea and vomiting -CT abdomen and pelvis without contrast--monitor stool with mild wall thickening in the cecum and ascending colon--> Suspect this may be artifact as CT was without contrast and patient had colonoscopy approximately one month ago significant only for diverticulosis -Lipase 19 -resolved -Advanced diet-->tolerating although had an episode of N/V after ensure 4/2-->no further vomiting  Acute on Chronic systolic CHF (HFrEF) -[id number] echo EF 35-40 percent -Clinically compensated at this time -Initially held lasix 20mg  daily and Aldactone 12.5 mg secondary to hypotension -restart lasix IVx 1 on 3/31 -4/3--appears euvolemic  Severe malnutrition -Continue ensure  Thrombocytopenia -Suspect this is chronic -The patient had thrombocytopenia during her last admission -Check fibrinogen--378 -B12--963  Elevated troponin/CAD s/p CABG x 2  -No chest pain presently -Likely demand ischemia in the setting of sepsis -Continue Lipitor  Tobacco abuse -Still smoking. 4 cigarettes per day -Patient has nearly 100-pack-year history -Tobacco cessation discussed  Hypokalemia/hypomagnesemia -replete  GOC -08/25/15--long discussion with daughter and pt-->continue present tx, but make DNR -4/2--consulted palliative  medicine per family request -08/28/15 family meeting with palliative medicine  Deconditioning -PT eval--HHPT   Family Communication: Daughter updated at beside 4/2 Disposition Plan:[facility] or SNF 4/4 if stable    Procedures/Studies: Ct Abdomen Pelvis W Contrast  08/22/2015  CLINICAL DATA:  Intractable nausea and vomiting. Leukocytosis. Fever. EXAM: CT ABDOMEN AND PELVIS WITH CONTRAST TECHNIQUE: Multidetector CT imaging of the abdomen  and pelvis was performed using the standard protocol following bolus administration of intravenous contrast. CONTRAST:  25mL ISOVUE-300 IOPAMIDOL (ISOVUE-300) INJECTION 61% COMPARISON:  None. FINDINGS: Lower chest: There is prominent patchy consolidation in the dependent basilar right lower lobe. Mild patchy ground-glass opacity in the peripheral left lower lobe. Visualized sternotomy wire is intact. Myocardial calcifications at the left ventricular apex from prior myocardial infarction. Coronary atherosclerosis. Hepatobiliary: Normal liver with no liver mass. Cholecystectomy. Bile ducts are within expected post cholecystectomy limits with common bile duct diameter 5 mm. Pancreas: Atrophic appearing pancreas with no pancreatic mass or pancreatic duct dilation. Spleen: Normal size. No mass. Adrenals/Urinary Tract: Normal adrenals. No hydronephrosis. No renal mass. Normal bladder. There is crescentic high density material surrounding the urethra, possibly representing post treatment changes, correlate with surgical history. Stomach/Bowel: Grossly normal stomach. Normal caliber small bowel with no small bowel wall thickening. Status post appendectomy . Oral contrast progresses to the rectum. Moderate stool throughout the colon and rectum. There is a suggestion of mild wall thickening in the cecum, ascending colon and hepatic flexure of the colon with associated mild pericolonic fat stranding. Vascular/Lymphatic: Markedly atherosclerotic nonaneurysmal abdominal aorta.  Patent portal, splenic, hepatic and renal veins. Large left splenorenal shunt. No pathologically enlarged lymph nodes in the abdomen or pelvis. Reproductive: Status post hysterectomy, with no abnormal findings at the vaginal cuff. No adnexal mass. Other: No pneumoperitoneum, ascites or focal fluid collection. Musculoskeletal: No aggressive appearing focal osseous lesions. Patient is status post bilateral posterior L5-S1 fusion, with no evidence of hardware fracture or loosening. Moderate degenerative changes in the visualized thoracolumbar spine. Left back subcutaneous spinal stimulator demonstrates 2 leads coursing into the thoracic spinal canal, with the tips not seen on this study. IMPRESSION: 1. Patchy basilar right lower lobe consolidation and mild patchy left lower lobe ground-glass opacities, favor multifocal pneumonia and/or aspiration. 2. Mild colonic wall thickening and pericolonic fat stranding in the proximal colon, suggesting a nonspecific mild infectious or inflammatory colitis, with the differential including C. diff colitis. No evidence of bowel obstruction. 3. Large left splenorenal shunt of uncertain etiology. Portal, hepatic, splenic and renal veins appear patent. No macroscopic evidence of cirrhosis. No ascites. 4. Coronary atherosclerosis and remote myocardial infarct at the left ventricular apex. 5. Crescentic high density material surrounding the urethra, correlate with surgical history. Electronically Signed   By: Ilona Sorrel M.D.   On: 08/22/2015 16:25   Dg Chest Port 1 View  08/24/2015  CLINICAL DATA:  Shortness of breath and cough for 1 day EXAM: PORTABLE CHEST 1 VIEW COMPARISON:  August 23, 2015 FINDINGS: There is bibasilar interstitial edema with mild patchy alveolar opacity in the bases. Lungs elsewhere clear. Heart is mildly enlarged with pulmonary vascularity within normal limits. No adenopathy. Patient is status post coronary artery bypass grafting. Stimulator leads are noted in  the mid thoracic region, stable. IMPRESSION: Bibasilar interstitial edema with patchy airspace opacity. Suspect a degree of congestive heart failure, although pneumonia or aspiration could present similarly. More than one of these entities may exist concurrently. Stable cardiac prominence. Electronically Signed   By: Lowella Grip III M.D.   On: 08/24/2015 10:20   Dg Chest Port 1 View  08/23/2015  CLINICAL DATA:  Patient with shortness of breath.  Vomiting. EXAM: PORTABLE CHEST 1 VIEW COMPARISON:  Chest radiograph 08/22/2015 FINDINGS: Stable enlarged cardiac and mediastinal contours status post median sternotomy and CABG procedure. Unchanged basilar heterogeneous opacities. No pleural effusion or pneumothorax. Osseous skeleton is unremarkable. IMPRESSION: Unchanged basilar  heterogeneous opacities which may represent atelectasis, aspiration or infection. Electronically Signed   By: Lovey Newcomer M.D.   On: 08/23/2015 12:23   Dg Chest Port 1 View  08/22/2015  CLINICAL DATA:  70 year old female with sepsis. EXAM: PORTABLE CHEST 1 VIEW COMPARISON:  None. FINDINGS: Single-view of the chest demonstrates bibasilar hazy densities which may be atelectatic changes or represent pneumonia. There is no focal consolidation or pleural effusion. Linear density along the lateral aspect of the right lung most likely or skin fold. A small pneumothorax is much less likely. Repeat radiograph recommended if there is clinical concern for pneumothorax. There is mild cardiomegaly. Median sternotomy wires and CABG vascular clips noted. Spinal stimulator wires noted. No acute fracture. IMPRESSION: Bibasilar atelectasis/infiltrate. Skin fold artifact versus less likely a small right pneumothorax. Repeat radiograph is recommended if there is high clinical concern for pneumothorax. Electronically Signed   By: Anner Crete M.D.   On: 08/22/2015 20:32         Subjective: Patient denies fevers, chills, headache, chest pain,  dyspnea, nausea, vomiting, diarrhea, abdominal pain, dysuria, hematuria   Objective: Filed Vitals:   08/26/15 2040 08/27/15 0441 08/27/15 1232 08/27/15 1509  BP: 91/39 114/41 117/55 113/55  Pulse: 63 70 68   Temp: 98.1 F (36.7 C) 98.5 F (36.9 C) 97.8 F (36.6 C) 98 F (36.7 C)  TempSrc: Oral Oral Oral Oral  Resp: 18 18 20    Height:      Weight:      SpO2: 98% 95% 97%     Intake/Output Summary (Last 24 hours) at 08/27/15 1945 Last data filed at 08/27/15 1500  Gross per 24 hour  Intake    220 ml  Output   1550 ml  Net  -1330 ml   Weight change:  Exam:   General:  Pt is alert, follows commands appropriately, not in acute distress  HEENT: No icterus, No thrush, No neck mass, [place]/AT  Cardiovascular: RRR, S1/S2, no rubs, no gallops  Respiratory: Bibasilar crackles. No wheezing. Good air movement  Abdomen: Soft/+BS, non tender, non distended, no guarding  Extremities: No edema, No lymphangitis, No petechiae, No rashes, no synovitis  Data Reviewed: Basic Metabolic Panel:  Recent Labs Lab 08/23/15 0554 08/24/15 0310 08/25/15 0328 08/26/15 0522 08/27/15 0517  NA 143 144 143 140 142  K 3.6 3.5 3.6 4.0 4.3  CL 116* 120* 116* 109 112*  CO2 21* 20* 23 24 25   GLUCOSE 109* 91 75 118* 114*  BUN 18 13 10 6 6   CREATININE 0.92 0.74 0.73 0.63 0.72  CALCIUM 7.7* 7.8* 7.9* 8.1* 8.5*  MG  --  1.3* 1.6* 2.0 1.7   Liver Function Tests:  Recent Labs Lab 08/22/15 1310 08/22/15 1946 08/23/15 0554  AST 49* 67* 62*  ALT 26 32 28  ALKPHOS 93 79 66  BILITOT 1.1 0.8 0.8  PROT 7.5 5.9* 5.4*  ALBUMIN 3.2* 2.4* 2.2*    Recent Labs Lab 08/22/15 1310  LIPASE 19   No results for input(s): AMMONIA in the last 168 hours. CBC:  Recent Labs Lab 08/22/15 1310 08/23/15 0554 08/24/15 0310 08/25/15 0328 08/26/15 0522  WBC 22.7* 19.7* 11.9* 7.8 8.3  NEUTROABS 20.9*  --   --   --   --   HGB 12.4 9.8* 9.0* 9.2* 10.9*  HCT 37.3 30.1* 28.2* 27.9* 33.2*  MCV 89.7 90.9 91.0  88.3 88.1  PLT 94* 75* 71* 78* 90*   Cardiac Enzymes:  Recent Labs Lab 08/22/15 1310  TROPONINI 0.10*   BNP: Invalid input(s): POCBNP CBG: No results for input(s): GLUCAP in the last 168 hours.  Recent Results (from the past 240 hour(s))  Culture, blood (routine x 2)     Status: None   Collection Time: 08/22/15  1:19 PM  Result Value Ref Range Status   Specimen Description BLOOD RIGHT ANTECUBITAL  Final   Special Requests BOTTLES DRAWN AEROBIC AND ANAEROBIC 5ML  Final   Culture   Final    NO GROWTH 5 DAYS Performed at [facility]    Report Status 08/27/2015 FINAL  Final  Culture, blood (routine x 2)     Status: None   Collection Time: 08/22/15  1:21 PM  Result Value Ref Range Status   Specimen Description BLOOD LEFT FOREARM  Final   Special Requests BOTTLES DRAWN AEROBIC AND ANAEROBIC 5ML  Final   Culture   Final    NO GROWTH 5 DAYS Performed at [facility]    Report Status 08/27/2015 FINAL  Final  Urine culture     Status: None   Collection Time: 08/22/15  1:34 PM  Result Value Ref Range Status   Specimen Description URINE, RANDOM  Final   Special Requests NONE  Final   Culture   Final    >=100,000 COLONIES/mL ESCHERICHIA COLI Performed at [facility]    Report Status 08/24/2015 FINAL  Final   Organism ID, Bacteria ESCHERICHIA COLI  Final      Susceptibility   Escherichia coli - MIC*    AMPICILLIN <=2 SENSITIVE Sensitive     CEFAZOLIN <=4 SENSITIVE Sensitive     CEFTRIAXONE <=1 SENSITIVE Sensitive     CIPROFLOXACIN <=0.25 SENSITIVE Sensitive     GENTAMICIN <=1 SENSITIVE Sensitive     IMIPENEM <=0.25 SENSITIVE Sensitive     NITROFURANTOIN <=16 SENSITIVE Sensitive     TRIMETH/SULFA <=20 SENSITIVE Sensitive     AMPICILLIN/SULBACTAM <=2 SENSITIVE Sensitive     PIP/TAZO <=4 SENSITIVE Sensitive     * >=100,000 COLONIES/mL ESCHERICHIA COLI  MRSA PCR Screening     Status: None   Collection Time: 08/22/15  7:35 PM  Result Value Ref Range  Status   MRSA by PCR NEGATIVE NEGATIVE Final    Comment:        The GeneXpert MRSA Assay (FDA approved for NASAL specimens only), is one component of a comprehensive MRSA colonization surveillance program. It is not intended to diagnose MRSA infection nor to guide or monitor treatment for MRSA infections.      Scheduled Meds: . amiodarone  400 mg Oral Daily  . amoxicillin  500 mg Oral 3 times per day  . antiseptic oral rinse  7 mL Mouth Rinse q12n4p  . atorvastatin  80 mg Oral q1800  . busPIRone  7.5 mg Oral BID  . chlorhexidine  15 mL Mouth Rinse BID  . feeding supplement (ENSURE ENLIVE)  237 mL Oral BID BM  . ferrous sulfate  325 mg Oral BID WC  . gabapentin  300 mg Oral BID  . Glycerin (Adult)  1 suppository Rectal Once  . hydrocortisone  25 mg Rectal BID  . midodrine  5 mg Oral TID WC  . oxyCODONE-acetaminophen  1 tablet Oral TID   And  . oxyCODONE  5 mg Oral TID  . polyethylene glycol  17 g Oral Daily  . rivaroxaban  15 mg Oral Q supper  . sodium chloride flush  3 mL Intravenous Q12H  . spironolactone  12.5 mg  Oral Daily  . traZODone  50 mg Oral QHS   Continuous Infusions:    Juanmiguel Defelice, DO  [facility] Pager [phone]  If 7PM-7AM, please contact night-coverage [email or web address] Password [email or web address] 08/27/2015, 7:45 PM   LOS: 5 days

## 2015-08-27 NOTE — Evaluation (Signed)
Physical Therapy Evaluation Patient Details Name: Deshaunda Hurrle MRN: [id number] DOB: 17-Jun-1945 Today's Date: 08/27/2015   History of Present Illness  70 year old female with a history of persistent atrial fibrillation on Xarelto, systolic CHF, hypertension, coronary artery disease, peripheral vascular disease, hyperlipidemia, anxiety and admitted for sepsis and UTI  Clinical Impression  Pt admitted with above diagnosis. Pt currently with functional limitations due to the deficits listed below (see PT Problem List).  Pt will benefit from skilled PT to increase their independence and safety with mobility to allow discharge to the venue listed below.   Pt reports her back pain and LE pain limit her mobility however she is typically able to ambulate to ALF dining hall with her rollator.  Pt reports ALF can provide assist if needed.  Pt reports pain in her LEs with dependent position and during ambulation, relieved with return to supine position.  Pt reports she has had dopplers and EMG studies performed and is awaiting results.  Pt would like to resume [place] services: RN, PT, OT upon d/c.     Follow Up Recommendations Home health PT (would like to resume services with [facility])    Equipment Recommendations  None recommended by PT    Recommendations for Other Services       Precautions / Restrictions Precautions Precautions: Fall Restrictions Weight Bearing Restrictions: No      Mobility  Bed Mobility Overal bed mobility: Needs Assistance Bed Mobility: Sit to Supine;Supine to Sit     Supine to sit: Supervision Sit to supine: Min guard   General bed mobility comments: more effort for bringing LEs onto bed however no physical assist required  Transfers Overall transfer level: Needs assistance Equipment used: 4-wheeled walker Transfers: Sit to/from Stand Sit to Stand: Min guard         General transfer comment: performs safely with brake use on  rollator  Ambulation/Gait Ambulation/Gait assistance: Min guard Ambulation Distance (Feet): 40 Feet Assistive device: 4-wheeled walker Gait Pattern/deviations: Step-through pattern;Trunk flexed;Decreased stride length     General Gait Details: pt reports pain in LEs increases to 6/10 with dependent position and ambulation, distance limited by back and leg pain  Stairs            Wheelchair Mobility    Modified Rankin (Stroke Patients Only)       Balance Overall balance assessment: History of Falls                                           Pertinent Vitals/Pain Pain Assessment: 0-10 Pain Score: 7  Pain Location: back and LEs Pain Descriptors / Indicators: Aching Pain Intervention(s): Limited activity within patient's tolerance;Monitored during session;Repositioned    Home Living Family/patient expects to be discharged to:: Assisted living               Home Equipment: Walker - 4 wheels      Prior Function Level of Independence: Independent with assistive device(s)         Comments: ambulates to dining hall with rollator, ALF provides staff to assist with ADLs if needed     Hand Dominance        Extremity/Trunk Assessment               Lower Extremity Assessment: Generalized weakness         Communication   Communication: No difficulties  Cognition Arousal/Alertness:  Awake/alert Behavior During Therapy: WFL for tasks assessed/performed Overall Cognitive Status: History of cognitive impairments - at baseline (impaired memory in hx)                      General Comments      Exercises        Assessment/Plan    PT Assessment Patient needs continued PT services  PT Diagnosis Difficulty walking   PT Problem List Decreased strength;Decreased activity tolerance;Decreased mobility;Pain  PT Treatment Interventions DME instruction;Functional mobility training;Patient/family education;Gait  training;Therapeutic activities;Therapeutic exercise   PT Goals (Current goals can be found in the Care Plan section) Acute Rehab PT Goals PT Goal Formulation: With patient Time For Goal Achievement: 09/03/15 Potential to Achieve Goals: Good    Frequency Min 3X/week   Barriers to discharge        Co-evaluation               End of Session Equipment Utilized During Treatment: Gait belt Activity Tolerance: Patient limited by pain Patient left: in bed;with call bell/phone within reach;with bed alarm set Nurse Communication: Mobility status         Time: [id number] PT Time Calculation (min) (ACUTE ONLY): 25 min   Charges:   PT Evaluation $PT Eval Low Complexity: 1 Procedure     PT G Codes:        Hutch Rhett,KATHrine E 08/27/2015, 11:52 AM Carmelia Bake, PT, DPT 08/27/2015 Pager: [phone]

## 2015-08-27 NOTE — Consult Note (Signed)
Consultation Note Date: 08/27/2015   Patient Name: Nicole Randall  DOB: 24-Nov-1945  MRN: [id number]  Age / Sex: 70 y.o., female  PCP: Gildardo Cranker, DO Referring Physician: Orson Eva, MD  Reason for Consultation: Establishing goals of care and Psychosocial/spiritual support   Clinical Assessment/Narrative:   70 year old female with a history of persistent atrial fibrillation on Xarelto, systolic CHF, hypertension, coronary artery disease, peripheral vascular disease, hyperlipidemia, anxiety presented with intractable nausea and vomiting on the morning of admission.   The patient woke up having numerous episodes of nausea and vomiting approximately 4:30 AM on 08/22/2015. It resolved for short period of time, but around 7:30 AM, the patient began having 3-4 episodes of emesis again. There was no hematemesis or coffee grounds material. The patient has subjective fevers and chills but denied any chest pain, Shortness breath, coughing, hemoptysis, diarrhea, hematochezia, melena. She denies any unusual rashes.  Notably, the patient was recently discharged from the hospital after a stay between 07/04/2015 through 07/09/2015 for upper GI bleed And acute blood loss anemia. Colonoscopy on 07/09/2015 revealed diverticulosis and EGD on 07/06/2015 suggested gastroparesis.   In the emergency department, the patient was noted to have WBC 22.7, platelets 94,000 with fever 101.60F.     Patient and her family report continued slow physical, and functional decline over the past two year.  She moved to [place] and it has been a "hard adjustment"  Giving up her home and settling in to an assisted living environment.  Emotional support offered.  This NP Wadie Lessen reviewed medical records, received report from team, assessed the patient and then meet at the patient's bedside  to discuss diagnosis, prognosis, GOC, and  options.   And then discussed with daughter by telephone.  To meet tomorrow at 0800 at bedside for further clarification of [place]   A detailed discussion was had today regarding advanced directives.  Concepts specific to code status, artifical feeding and hydration,rehospitalization was had.    Values and goals of care important to patient and family were attempted to be elicited.  Concept of Hospice and Palliative Care were discussed   Questions and concerns addressed.   Family encouraged to call with questions or concerns.  PMT will continue to support holistically.   HCPOA: yes    SUMMARY OF RECOMMENDATIONS  Code Status/Advance Care Planning: DNR    Code Status Orders        Start     Ordered   08/25/15 1635  Do not attempt resuscitation (DNR)   Continuous    Question Answer Comment  In the event of cardiac or respiratory ARREST Do not call a "code blue"   In the event of cardiac or respiratory ARREST Do not perform Intubation, CPR, defibrillation or ACLS   In the event of cardiac or respiratory ARREST Use medication by any route, position, wound care, and other measures to relive pain and suffering. May use oxygen, suction and manual treatment of airway obstruction as needed for comfort.      08/25/15 1634    Code Status History    Date Active Date Inactive Code Status Order ID Comments User Context   08/22/2015  7:33 PM 08/25/2015  4:34 PM Full Code [id number]  Orson Eva, MD Inpatient   07/04/2015  8:09 PM 07/09/2015  8:13 PM Full Code [id number]  Reubin Milan, MD ED       Symptom Management:   Weakness: PT evaluation and treatment  Chronic pain: patient report "adequate " pain control "  when I take the medications".  She reports she doesn't like to take pain medications  Palliative Prophylaxis:   Bowel Regimen and Frequent Pain Assessment   Psycho-social/Spiritual:  Support System: [place] Desire for further Chaplaincy support:no   Discharge Planning:   Assisted  Living with PCS   Chief Complaint/ Primary Diagnoses: Present on Admission:  . Sepsis ([place]) . Protein-calorie malnutrition, severe . PAF (paroxysmal atrial fibrillation) ([place])  I have reviewed the medical record, interviewed the patient and family, and examined the patient. The following aspects are pertinent.  Past Medical History  Diagnosis Date  . CHF (congestive heart failure) ([place])   . Persistent atrial fibrillation ([place])   . Bone spur   . Arthritis   . Coronary artery disease     a. s/p CABG x2  . Hypertension   . OSA on CPAP   . Impaired memory   . Depression with anxiety   . Gait disturbance   . Osteoporosis   . GI bleed   . Carotid artery disease ([place])     a. s/p L CEA  . Mitral valvular regurgitation     a. moderate to severe  . PAD (peripheral artery disease) (HCC)     a. s/p PTA of left internal iliac  . Cancer [facility])     carcinoma left leg   Social History   Social History  . Marital Status: Widowed    Spouse Name: N/A  . Number of Children: N/A  . Years of Education: N/A   Occupational History  . Retired.    Social History Main Topics  . Smoking status: Current Some Day Smoker -- 0.20 packs/day  . Smokeless tobacco: Never Used  . Alcohol Use: No  . Drug Use: No  . Sexual Activity: Not Asked   Other Topics Concern  . None   Social History Narrative   Lives in [place]. Daughter is nearby.   Family History  Problem Relation Age of Onset  . Lung cancer Mother   . Heart disease Father   . Throat cancer Brother    Scheduled Meds: . amiodarone  400 mg Oral Daily  . amoxicillin  500 mg Oral 3 times per day  . antiseptic oral rinse  7 mL Mouth Rinse q12n4p  . atorvastatin  80 mg Oral q1800  . busPIRone  7.5 mg Oral BID  . chlorhexidine  15 mL Mouth Rinse BID  . feeding supplement (ENSURE ENLIVE)  237 mL Oral BID BM  . ferrous sulfate  325 mg Oral BID WC  . gabapentin  300 mg Oral BID  . Glycerin (Adult)  1 suppository  Rectal Once  . hydrocortisone  25 mg Rectal BID  . midodrine  5 mg Oral TID WC  . oxyCODONE-acetaminophen  1 tablet Oral TID   And  . oxyCODONE  5 mg Oral TID  . polyethylene glycol  17 g Oral Daily  . rivaroxaban  15 mg Oral Q supper  . sodium chloride flush  3 mL Intravenous Q12H  . spironolactone  12.5 mg Oral Daily  . traZODone  50 mg Oral QHS   Continuous Infusions:  PRN Meds:.acetaminophen, albuterol, calcium carbonate, nitroGLYCERIN, ondansetron **OR** ondansetron (ZOFRAN) IV, polyethylene glycol, senna, traMADol Medications Prior to Admission:  Prior to Admission medications   Medication Sig Start Date End Date Taking? Authorizing Provider  atorvastatin (LIPITOR) 80 MG tablet Take 80 mg by mouth daily.   Yes Historical Provider, MD  busPIRone (BUSPAR) 7.5 MG tablet Take  7.5 mg by mouth 2 (two) times daily.   Yes Historical Provider, MD  feeding supplement, ENSURE ENLIVE, (ENSURE ENLIVE) LIQD Take 237 mLs by mouth 2 (two) times daily between meals. 07/09/15  Yes Modena Jansky, MD  ferrous sulfate 325 (65 FE) MG tablet Take 1 tablet (325 mg total) by mouth 2 (two) times daily with a meal. 07/09/15  Yes Modena Jansky, MD  gabapentin (NEURONTIN) 300 MG capsule Take 300 mg by mouth 2 (two) times daily.    Yes Historical Provider, MD  midodrine (PROAMATINE) 5 MG tablet Take 5 mg by mouth 3 (three) times daily with meals.   Yes Historical Provider, MD  oxyCODONE-acetaminophen (PERCOCET) 10-325 MG tablet Take 1 tablet by mouth 3 (three) times daily.   Yes Historical Provider, MD  potassium chloride (K-DUR,KLOR-CON) 10 MEQ tablet Take 10 mEq by mouth 2 (two) times daily.   Yes Historical Provider, MD  Rivaroxaban (XARELTO) 15 MG TABS tablet Take 15 mg by mouth daily.    Yes Historical Provider, MD  spironolactone (ALDACTONE) 25 MG tablet Take 12.5 mg by mouth daily. Take one-half tablet (12.5mg )  daily   Yes Historical Provider, MD  traMADol (ULTRAM) 50 MG tablet Take 50 mg by mouth  every 6 (six) hours as needed. Take 1-2 tablets (50-100mg ) every 4 hours as needed for pain   Yes Historical Provider, MD  traZODone (DESYREL) 50 MG tablet Take 50 mg by mouth at bedtime.   Yes Historical Provider, MD  acetaminophen (TYLENOL) 500 MG tablet Take 500 mg by mouth every 6 (six) hours as needed (body pain).    Historical Provider, MD  albuterol (PROVENTIL) (2.5 MG/3ML) 0.083% nebulizer solution Take 2.5 mg by nebulization every 6 (six) hours as needed for wheezing or shortness of breath.    Historical Provider, MD  calcium carbonate (TUMS EX) 750 MG chewable tablet Chew 1 tablet by mouth 3 (three) times daily as needed for heartburn.    Historical Provider, MD  Melatonin 5 MG CAPS Take 1 capsule by mouth at bedtime as needed (sleep).    Historical Provider, MD  nitroGLYCERIN (NITROSTAT) 0.4 MG SL tablet Place 1 tablet (0.4 mg total) under the tongue every 5 (five) minutes as needed for chest pain. Up to 3 doses 07/23/15   Imogene Burn, PA-C  polyethylene glycol [facility] / GLYCOLAX) packet Take 17 g by mouth daily as needed (constipation).     Historical Provider, MD  senna (SENOKOT) 8.6 MG tablet Take 1 tablet by mouth daily as needed for constipation.    Historical Provider, MD   Allergies  Allergen Reactions  . Eliquis [Apixaban] Rash    Cardiology switched to xarelto    Review of Systems  Constitutional: Positive for fatigue.       -generalized weakness  Musculoskeletal: Positive for back pain.    Physical Exam  Constitutional: She is cooperative. She appears ill.  Frail, elderly  Neurological: She is alert.  Skin: Skin is warm and dry.    Vital Signs: BP 114/41 mmHg  Pulse 70  Temp(Src) 98.5 F (36.9 C) (Oral)  Resp 18  Ht 5\' 3"  (1.6 m)  Wt 55.8 kg (123 lb 0.3 oz)  BMI 21.80 kg/m2  SpO2 95%  SpO2: SpO2: 95 % O2 Device:SpO2: 95 % O2 Flow Rate: .O2 Flow Rate (L/min): 3 L/min  IO: Intake/output summary:  Intake/Output Summary (Last 24 hours) at 08/27/15  0919 Last data filed at 08/26/15 2100  Gross per 24 hour  Intake  240 ml  Output   1700 ml  Net  -1460 ml    LBM: Last BM Date: 08/24/15 Baseline Weight: Weight: 46.72 kg (103 lb) Most recent weight: Weight: 55.8 kg (123 lb 0.3 oz)      Palliative Assessment/Data:    Additional Data Reviewed:  CBC:    Component Value Date/Time   WBC 8.3 08/26/2015 0522   HGB 10.9* 08/26/2015 0522   HCT 33.2* 08/26/2015 0522   PLT 90* 08/26/2015 0522   MCV 88.1 08/26/2015 0522   NEUTROABS 20.9* 08/22/2015 1310   LYMPHSABS 0.7 08/22/2015 1310   MONOABS 1.1* 08/22/2015 1310   EOSABS 0.0 08/22/2015 1310   BASOSABS 0.0 08/22/2015 1310   Comprehensive Metabolic Panel:    Component Value Date/Time   NA 142 08/27/2015 0517   K 4.3 08/27/2015 0517   CL 112* 08/27/2015 0517   CO2 25 08/27/2015 0517   BUN 6 08/27/2015 0517   CREATININE 0.72 08/27/2015 0517   CREATININE 0.81 07/27/2015 1201   GLUCOSE 114* 08/27/2015 0517   CALCIUM 8.5* 08/27/2015 0517   AST 62* 08/23/2015 0554   ALT 28 08/23/2015 0554   ALKPHOS 66 08/23/2015 0554   BILITOT 0.8 08/23/2015 0554   PROT 5.4* 08/23/2015 0554   ALBUMIN 2.2* 08/23/2015 0554   Discussed woth Dr Tat  Time In: 0900 Time Out: 1015 Time Total: 75 min Greater than 50%  of this time was spent counseling and coordinating care related to the above assessment and plan.  Signed by: Wadie Lessen, NP  Knox Royalty, NP  08/27/2015, 9:19 AM  Please contact Palliative Medicine Team phone at [phone] for questions and concerns.

## 2015-08-27 NOTE — Progress Notes (Signed)
Subjective: Feeling better.  Not CP, SOB, orthopnea  Objective: Vital signs in last 24 hours: Temp:  [97.9 F (36.6 C)-98.5 F (36.9 C)] 98.5 F (36.9 C) (04/03 0441) Pulse Rate:  [63-75] 70 (04/03 0441) Resp:  [18] 18 (04/03 0441) BP: (91-121)/(39-58) 114/41 mmHg (04/03 0441) SpO2:  [95 %-98 %] 95 % (04/03 0441) Last BM Date: 08/24/15  Intake/Output from previous day: 04/02 0701 - 04/03 0700 In: 240 [P.O.:240] Out: 1700 [Urine:1700] Intake/Output this shift: Total I/O In: -  Out: 350 [Urine:350]  Medications Scheduled Meds: . amiodarone  400 mg Oral Daily  . amoxicillin  500 mg Oral 3 times per day  . antiseptic oral rinse  7 mL Mouth Rinse q12n4p  . atorvastatin  80 mg Oral q1800  . busPIRone  7.5 mg Oral BID  . chlorhexidine  15 mL Mouth Rinse BID  . feeding supplement (ENSURE ENLIVE)  237 mL Oral BID BM  . ferrous sulfate  325 mg Oral BID WC  . gabapentin  300 mg Oral BID  . Glycerin (Adult)  1 suppository Rectal Once  . hydrocortisone  25 mg Rectal BID  . midodrine  5 mg Oral TID WC  . oxyCODONE-acetaminophen  1 tablet Oral TID   And  . oxyCODONE  5 mg Oral TID  . polyethylene glycol  17 g Oral Daily  . rivaroxaban  15 mg Oral Q supper  . sodium chloride flush  3 mL Intravenous Q12H  . spironolactone  12.5 mg Oral Daily  . traZODone  50 mg Oral QHS   Continuous Infusions:  PRN Meds:.acetaminophen, albuterol, calcium carbonate, nitroGLYCERIN, ondansetron **OR** ondansetron (ZOFRAN) IV, polyethylene glycol, senna, traMADol  PE: General appearance: alert, cooperative and no distress Neck: no JVD Lungs: clear to auscultation bilaterally Heart: regular rate and rhythm and 1/6 sys MM Extremities: No LEE Pulses: 2+ and symmetric Skin: warm and dry  Lab Results:   Recent Labs  08/25/15 0328 08/26/15 0522  WBC 7.8 8.3  HGB 9.2* 10.9*  HCT 27.9* 33.2*  PLT 78* 90*   BMET  Recent Labs  08/25/15 0328 08/26/15 0522 08/27/15 0517  NA 143 140  142  K 3.6 4.0 4.3  CL 116* 109 112*  CO2 23 24 25   GLUCOSE 75 118* 114*  BUN 10 6 6   CREATININE 0.73 0.63 0.72  CALCIUM 7.9* 8.1* 8.5*    Assessment/Plan 70 y.o. year old female with a history of afib on amio and Xarelto, CABG, HTN, OSA on CPAP, L-CEA, MR, PAD, ICM w/ EF 35-40% by echo 06/2015. She was initially diagnosed with afib in [place], 10/2014. S/P DCCV several times, but did not stay in [place] till started on amio. Moved to [place], now sees Dr Burt Knack.  Patient presented with nausea and vomiting on March 29.  Elevated lactic acid  Active Problems:   PAF (paroxysmal atrial fibrillation) (HCC) Maintaining NSR.  Amiodarone increased to 400mg  daily yesterday.  On Xarelto.  Can likely decrease Amio dose 2-4 weeks.     Acute on chronic systolic CHF (congestive heart failure) (HCC)   Appears euvolemic.  Echo 07/18/2015:  EF 35-40% which is slightly improved from prior on 07/05/15.  Net fluids: -1.5L/-5.0L. SP one dose IV lasix three days ago after getting 1.6L of IV fluids.  Aldactone 12.5 daily.  SCr stable.    CAD  No chest pain on statin.    PAD.   She had recent doppler of aorta and iliacs.  An appt., which  is tomorrow, was set up with Dr. Fletcher Anon to discuss the results.  If she is not discharged today, we can reschedule.     Protein-calorie malnutrition, severe   Sepsis ([place])   UTI (lower urinary tract infection)   Emesis   Blood per rectum   Demand ischemia [facility]): Troponin 0.1, 29   Escherichia coli sepsis ([place])     LOS: 5 days    HAGER, BRYAN PA-C 08/27/2015 11:43 AM   Patient examined chart reviewed Rhythm stable  Continue amiodarone  Euvolemic with no CHF Continue aldactone.  Rx sepsis per primary team   Jenkins Rouge

## 2015-08-28 ENCOUNTER — Ambulatory Visit: Payer: MEDICARE | Admitting: Cardiovascular Disease

## 2015-08-28 ENCOUNTER — Telehealth: Payer: Self-pay | Admitting: Cardiovascular Disease

## 2015-08-28 DIAGNOSIS — R0600 Dyspnea, unspecified: Secondary | ICD-10-CM

## 2015-08-28 LAB — BASIC METABOLIC PANEL
ANION GAP: 5 (ref 5–15)
BUN: 7 mg/dL (ref 6–20)
CALCIUM: 8.1 mg/dL — AB (ref 8.9–10.3)
CO2: 27 mmol/L (ref 22–32)
CREATININE: 0.85 mg/dL (ref 0.44–1.00)
Chloride: 110 mmol/L (ref 101–111)
Glucose, Bld: 106 mg/dL — ABNORMAL HIGH (ref 65–99)
Potassium: 4 mmol/L (ref 3.5–5.1)
SODIUM: 142 mmol/L (ref 135–145)

## 2015-08-28 LAB — CBC
HCT: 28.3 % — ABNORMAL LOW (ref 36.0–46.0)
Hemoglobin: 9 g/dL — ABNORMAL LOW (ref 12.0–15.0)
MCH: 29 pg (ref 26.0–34.0)
MCHC: 31.8 g/dL (ref 30.0–36.0)
MCV: 91.3 fL (ref 78.0–100.0)
PLATELETS: 81 10*3/uL — AB (ref 150–400)
RBC: 3.1 MIL/uL — AB (ref 3.87–5.11)
RDW: 23.4 % — ABNORMAL HIGH (ref 11.5–15.5)
WBC: 5.7 10*3/uL (ref 4.0–10.5)

## 2015-08-28 MED ORDER — HYDROCORTISONE ACETATE 25 MG RE SUPP: 25.0000 mg | Freq: Two times a day (BID) | RECTAL | Status: DC

## 2015-08-28 MED ORDER — AMIODARONE HCL 400 MG PO TABS: 400.0000 mg | ORAL_TABLET | Freq: Every day | ORAL | Status: AC

## 2015-08-28 MED ORDER — TRAMADOL HCL 50 MG PO TABS: 50.0000 mg | ORAL_TABLET | Freq: Four times a day (QID) | ORAL | Status: DC | PRN

## 2015-08-28 MED ORDER — OXYCODONE-ACETAMINOPHEN 10-325 MG PO TABS: 1.0000 | ORAL_TABLET | Freq: Three times a day (TID) | ORAL | Status: AC

## 2015-08-28 NOTE — Progress Notes (Signed)
Patient is set to discharge back to [facility]F today. Patient & daughter at bedside, Nicole Randall aware. Discharge packet given to RN, Anderson Malta. [facility] will be called for transport once hospital bed has been delivered to the ALF.      Raynaldo Opitz, [facility] Clinical Social Worker cell #: [phone]

## 2015-08-28 NOTE — Care Management Important Message (Signed)
Important Message  Patient Details  Name: Nicole Randall MRN: [id number] Date of Birth: [birth year]   Medicare Important Message Given:  Yes    Camillo Flaming 08/28/2015, 10:28 [place] Message  Patient Details  Name: Nicole Randall MRN: [id number] Date of Birth: March 31, 1946   Medicare Important Message Given:  Yes    Camillo Flaming 08/28/2015, 10:28 AM

## 2015-08-28 NOTE — Care Management Note (Signed)
Case Management Note  Patient Details  Name: Nicole Randall MRN: [id number] Date of Birth: [birth year]  Subjective/Objective: Spoke to patient in rm/dtr on phone about hospital bed-informed there must be a medical need for a hospital bed for insurance to cover, informed the insurance may or may not cover.Dr. Carles Collet ordered hospital bed-based upon the all the assessments provided. Patient/dtr aware of the cost out of pocket per [facility] dme rep$128/month-patient agree to pay.Patient to return back to ALF-[place] w/[facility] services-rep Edwinna aware of d/c & HHC orders. [facility] & [facility] dme rep will coordinate delivery of bed to ALF-rep aware to delivery bed prior to patient being d/c back to ALF.                  Action/Plan:   Expected Discharge Date:   (unknown)               Expected Discharge Plan:  Assisted Living / Rest Home  In-House Referral:  Clinical Social Work  Discharge planning Services  CM Consult  Post Acute Care Choice:  Home Health (Active w/[facility] HHRN/HHPT) Choice offered to:  Patient  DME Arranged:  Hospital bed DME Agency:  [place]:    [place]:  [place]  Status of Service:  Completed, signed off  Medicare Important Message Given:    Date Medicare IM Given:    Medicare IM give by:    Date Additional Medicare IM Given:    Additional Medicare Important Message give by:     If discussed at [place] of Stay Meetings, dates discussed:    Additional Comments:  Dessa Phi, RN 08/28/2015, 10:23 AM

## 2015-08-28 NOTE — Discharge Summary (Addendum)
Physician Discharge Summary  Rolena Sappenfield [id number] DOB: 1945-11-10 DOA: 08/22/2015  PCP: Gildardo Cranker, DO  Admit date: 08/22/2015 Discharge date: 08/28/2015  Recommendations for Outpatient Follow-up:  1. Pt will need to follow up with PCP in 2 weeks post discharge 2. Please obtain BMP and CBC in one week 3. Continue amiodarone 400 mg daily x 3 more weeks, then decrease to 200mg  daily 4. Hold amiodarone and aldactone for SBP <90  Discharge Diagnoses:  Sepsis -Secondary to genitourinary source/UTI -Initially on cefepime pending culture data -Blood cultures 2 sets--neg to date -Urine culture--Ecoli -Chest x-ray--bibasilar atelectasis -Lactic acid 2.88 -Procalcitonin--12.72 -overall BP's improved--the patient has low blood pressures in the early morning hours-->improved overall  UTI--E.Coli -cefepime 3/29-3/31 -ampicillin 3/31-4/1 -amoxil 4/1-->4/4 -finished 7 days  Paroxysmal atrial fibrillation -3/30--Discontinued xarelto due to recurrent hematochezia -Patient was not deemed to be a good candidate for Watchman device due to her comorbidities -CHADS-VASc = 5 -reconsulted cardiology -08/25/15--restarted rivaroxaban-->hgb stable  Hematochezia -07/09/2015 colonoscopy--moderate diverticulosis -07/06/2015 EGD--no source of GI bleed -08/23/15--hematochezia -suspect diverticular bleed in setting of rivaroxaban -08/24/15--consult GI--no intervention for now-->Anusol supp for 1 week (5 more days after d/c) -Hgb stable  Intractable nausea and vomiting -CT abdomen and pelvis without contrast--monitor stool with mild wall thickening in the cecum and ascending colon--> Suspect this may be artifact as CT was without contrast and patient had colonoscopy approximately one month ago significant only for diverticulosis -Lipase 19 -resolved -Advanced diet-->tolerating although had an episode of N/V after ensure 4/2-->no further vomiting  Acute on Chronic systolic CHF  (HFrEF) -[id number] echo EF 35-40 percent -Clinically compensated at this time -Initially held lasix 20mg  daily and Aldactone 12.5 mg secondary to hypotension -restart lasix IVx 1 on 3/31 -4/3--appears euvolemic -discharge weight 123 lbs  Severe malnutrition -Continue ensure  Thrombocytopenia -Suspect this is chronic from even prior to Feb 2017 -The patient had thrombocytopenia during her last admission -Check fibrinogen--378 -B12--963  Elevated troponin/CAD s/p CABG x 2  -No chest pain presently -Likely demand ischemia in the setting of sepsis -Continue Lipitor  Tobacco abuse -Still smoking. 4 cigarettes per day -Patient has nearly 100-pack-year history -Tobacco cessation discussed  Hypokalemia/hypomagnesemia -repleted  GOC -08/25/15--long discussion with daughter and pt-->continue present tx, but make DNR -4/2--consulted palliative medicine per family request -08/28/15 family meeting with palliative medicine--DNR, no MOST form for now  Deconditioning -PT eval--HHPT  Discharge Condition: stable  Disposition:  Follow-up Information    Follow up with Ermalinda Barrios, PA-C In 2 weeks.   Specialty:  Cardiology   Contact information:   [address] [place] [address] [phone]     Morning view with PT  Diet:heart healthy Wt Readings from Last 3 Encounters:  08/25/15 55.8 kg (123 lb 0.3 oz)  07/27/15 48.898 kg (107 lb 12.8 oz)  07/23/15 49.442 kg (109 lb)    History of present illness:  70 year old female with a history of persistent atrial fibrillation on Xarelto, systolic CHF, hypertension, coronary artery disease, peripheral vascular disease, hyperlipidemia, anxiety presented with intractable nausea and vomiting on the morning of admission. The patient woke up having numerous episodes of nausea and vomiting approximately 4:30 AM on 08/22/2015. It resolved for short period of time, but around 7:30 AM, the patient began having 3-4 episodes of  emesis again. There was no hematemesis or coffee grounds material. Notably, the patient was recently discharged from the hospital after a stay between 07/04/2015 through 07/09/2015 for upper GI bleed And acute blood loss anemia. Colonoscopy on 07/09/2015  revealed diverticulosis and EGD on 07/06/2015 suggested gastroparesis. The patient's rivaroxaban was restarted and aspirin was discontinued at the time of that discharge.  In the emergency department, the patient was noted to have WBC 22.7, platelets 94,000 with fever 101.60F. The patienthad soft blood pressures with systolic blood pressures in the 90s. The patient was given 30 mL/kg IV fluid bolus and started on Zosyn. Lactic acid was 2.88. Serum creatinine was 1.12. Urinalysis showed TNTC WBC.the patient was aggressively fluid resuscitated. Unfortunately, the patient developed fluid overload and intravenous furosemide was started on 08/24/2015. In addition, the patient developed hematochezia on 08/23/2015. Rivaroxaban was discontinued on 08/23/15. Cardiology was consulted. Gastroenterology was also consulted.  Consultants: Cardiology [facility]  Discharge Exam: Filed Vitals:   08/27/15 2049 08/28/15 0524  BP: 115/47 107/39  Pulse: 69 68  Temp: 98 F (36.7 C) 98.2 F (36.8 C)  Resp: 18 18   Filed Vitals:   08/27/15 1232 08/27/15 1509 08/27/15 2049 08/28/15 0524  BP: 117/55 113/55 115/47 107/39  Pulse: 68  69 68  Temp: 97.8 F (36.6 C) 98 F (36.7 C) 98 F (36.7 C) 98.2 F (36.8 C)  TempSrc: Oral Oral Oral Oral  Resp: 20  18 18   Height:      Weight:      SpO2: 97%  98% 94%   General: A&O x 3, NAD, pleasant, cooperative Cardiovascular: RRR, no rub, no gallop, no S3 Respiratory: CTAB, no wheeze, no rhonchi Abdomen:soft, nontender, nondistended, positive bowel sounds Extremities: No edema, No lymphangitis, no petechiae  Discharge Instructions      Discharge Instructions    Diet - low sodium heart healthy    Complete by:   As directed      Increase activity slowly    Complete by:  As directed             Medication List    STOP taking these medications        potassium chloride 10 MEQ tablet  Commonly known as:  K-DUR,KLOR-CON      TAKE these medications        acetaminophen 500 MG tablet  Commonly known as:  TYLENOL  Take 500 mg by mouth every 6 (six) hours as needed (body pain).     albuterol (2.5 MG/3ML) 0.083% nebulizer solution  Commonly known as:  PROVENTIL  Take 2.5 mg by nebulization every 6 (six) hours as needed for wheezing or shortness of breath.     amiodarone 400 MG tablet  Commonly known as:  PACERONE  Take 1 tablet (400 mg total) by mouth daily.     atorvastatin 80 MG tablet  Commonly known as:  LIPITOR  Take 80 mg by mouth daily.     busPIRone 7.5 MG tablet  Commonly known as:  BUSPAR  Take 7.5 mg by mouth 2 (two) times daily.     calcium carbonate 750 MG chewable tablet  Commonly known as:  TUMS EX  Chew 1 tablet by mouth 3 (three) times daily as needed for heartburn.     feeding supplement (ENSURE ENLIVE) Liqd  Take 237 mLs by mouth 2 (two) times daily between meals.     ferrous sulfate 325 (65 FE) MG tablet  Take 1 tablet (325 mg total) by mouth 2 (two) times daily with a meal.     gabapentin 300 MG capsule  Commonly known as:  NEURONTIN  Take 300 mg by mouth 2 (two) times daily.     hydrocortisone 25 MG suppository  Commonly known as:  ANUSOL-HC  Place 1 suppository (25 mg total) rectally 2 (two) times daily. X 5 more days     Melatonin 5 MG Caps  Take 1 capsule by mouth at bedtime as needed (sleep).     midodrine 5 MG tablet  Commonly known as:  PROAMATINE  Take 5 mg by mouth 3 (three) times daily with meals.     nitroGLYCERIN 0.4 MG SL tablet  Commonly known as:  NITROSTAT  Place 1 tablet (0.4 mg total) under the tongue every 5 (five) minutes as needed for chest pain. Up to 3 doses     oxyCODONE-acetaminophen 10-325 MG tablet  Commonly known as:   PERCOCET  Take 1 tablet by mouth 3 (three) times daily.     polyethylene glycol packet  Commonly known as:  MIRALAX / GLYCOLAX  Take 17 g by mouth daily as needed (constipation).     Rivaroxaban 15 MG Tabs tablet  Commonly known as:  XARELTO  Take 15 mg by mouth daily.     senna 8.6 MG tablet  Commonly known as:  SENOKOT  Take 1 tablet by mouth daily as needed for constipation.     spironolactone 25 MG tablet  Commonly known as:  ALDACTONE  Take 12.5 mg by mouth daily. Take one-half tablet (12.5mg )  daily     traMADol 50 MG tablet  Commonly known as:  ULTRAM  Take 1 tablet (50 mg total) by mouth every 6 (six) hours as needed. Take 1-2 tablets (50-100mg ) every 4 hours as needed for pain     traZODone 50 MG tablet  Commonly known as:  DESYREL  Take 50 mg by mouth at bedtime.         The results of significant diagnostics from this hospitalization (including imaging, microbiology, ancillary and laboratory) are listed below for reference.    Significant Diagnostic Studies: Ct Abdomen Pelvis W Contrast  08/22/2015  CLINICAL DATA:  Intractable nausea and vomiting. Leukocytosis. Fever. EXAM: CT ABDOMEN AND PELVIS WITH CONTRAST TECHNIQUE: Multidetector CT imaging of the abdomen and pelvis was performed using the standard protocol following bolus administration of intravenous contrast. CONTRAST:  66mL ISOVUE-300 IOPAMIDOL (ISOVUE-300) INJECTION 61% COMPARISON:  None. FINDINGS: Lower chest: There is prominent patchy consolidation in the dependent basilar right lower lobe. Mild patchy ground-glass opacity in the peripheral left lower lobe. Visualized sternotomy wire is intact. Myocardial calcifications at the left ventricular apex from prior myocardial infarction. Coronary atherosclerosis. Hepatobiliary: Normal liver with no liver mass. Cholecystectomy. Bile ducts are within expected post cholecystectomy limits with common bile duct diameter 5 mm. Pancreas: Atrophic appearing pancreas with  no pancreatic mass or pancreatic duct dilation. Spleen: Normal size. No mass. Adrenals/Urinary Tract: Normal adrenals. No hydronephrosis. No renal mass. Normal bladder. There is crescentic high density material surrounding the urethra, possibly representing post treatment changes, correlate with surgical history. Stomach/Bowel: Grossly normal stomach. Normal caliber small bowel with no small bowel wall thickening. Status post appendectomy . Oral contrast progresses to the rectum. Moderate stool throughout the colon and rectum. There is a suggestion of mild wall thickening in the cecum, ascending colon and hepatic flexure of the colon with associated mild pericolonic fat stranding. Vascular/Lymphatic: Markedly atherosclerotic nonaneurysmal abdominal aorta. Patent portal, splenic, hepatic and renal veins. Large left splenorenal shunt. No pathologically enlarged lymph nodes in the abdomen or pelvis. Reproductive: Status post hysterectomy, with no abnormal findings at the vaginal cuff. No adnexal mass. Other: No pneumoperitoneum, ascites or focal fluid collection. Musculoskeletal: No  aggressive appearing focal osseous lesions. Patient is status post bilateral posterior L5-S1 fusion, with no evidence of hardware fracture or loosening. Moderate degenerative changes in the visualized thoracolumbar spine. Left back subcutaneous spinal stimulator demonstrates 2 leads coursing into the thoracic spinal canal, with the tips not seen on this study. IMPRESSION: 1. Patchy basilar right lower lobe consolidation and mild patchy left lower lobe ground-glass opacities, favor multifocal pneumonia and/or aspiration. 2. Mild colonic wall thickening and pericolonic fat stranding in the proximal colon, suggesting a nonspecific mild infectious or inflammatory colitis, with the differential including C. diff colitis. No evidence of bowel obstruction. 3. Large left splenorenal shunt of uncertain etiology. Portal, hepatic, splenic and renal  veins appear patent. No macroscopic evidence of cirrhosis. No ascites. 4. Coronary atherosclerosis and remote myocardial infarct at the left ventricular apex. 5. Crescentic high density material surrounding the urethra, correlate with surgical history. Electronically Signed   By: Ilona Sorrel M.D.   On: 08/22/2015 16:25   Dg Chest Port 1 View  08/24/2015  CLINICAL DATA:  Shortness of breath and cough for 1 day EXAM: PORTABLE CHEST 1 VIEW COMPARISON:  August 23, 2015 FINDINGS: There is bibasilar interstitial edema with mild patchy alveolar opacity in the bases. Lungs elsewhere clear. Heart is mildly enlarged with pulmonary vascularity within normal limits. No adenopathy. Patient is status post coronary artery bypass grafting. Stimulator leads are noted in the mid thoracic region, stable. IMPRESSION: Bibasilar interstitial edema with patchy airspace opacity. Suspect a degree of congestive heart failure, although pneumonia or aspiration could present similarly. More than one of these entities may exist concurrently. Stable cardiac prominence. Electronically Signed   By: Lowella Grip III M.D.   On: 08/24/2015 10:20   Dg Chest Port 1 View  08/23/2015  CLINICAL DATA:  Patient with shortness of breath.  Vomiting. EXAM: PORTABLE CHEST 1 VIEW COMPARISON:  Chest radiograph 08/22/2015 FINDINGS: Stable enlarged cardiac and mediastinal contours status post median sternotomy and CABG procedure. Unchanged basilar heterogeneous opacities. No pleural effusion or pneumothorax. Osseous skeleton is unremarkable. IMPRESSION: Unchanged basilar heterogeneous opacities which may represent atelectasis, aspiration or infection. Electronically Signed   By: Lovey Newcomer M.D.   On: 08/23/2015 12:23   Dg Chest Port 1 View  08/22/2015  CLINICAL DATA:  70 year old female with sepsis. EXAM: PORTABLE CHEST 1 VIEW COMPARISON:  None. FINDINGS: Single-view of the chest demonstrates bibasilar hazy densities which may be atelectatic changes  or represent pneumonia. There is no focal consolidation or pleural effusion. Linear density along the lateral aspect of the right lung most likely or skin fold. A small pneumothorax is much less likely. Repeat radiograph recommended if there is clinical concern for pneumothorax. There is mild cardiomegaly. Median sternotomy wires and CABG vascular clips noted. Spinal stimulator wires noted. No acute fracture. IMPRESSION: Bibasilar atelectasis/infiltrate. Skin fold artifact versus less likely a small right pneumothorax. Repeat radiograph is recommended if there is high clinical concern for pneumothorax. Electronically Signed   By: Anner Crete M.D.   On: 08/22/2015 20:32     Microbiology: Recent Results (from the past 240 hour(s))  Culture, blood (routine x 2)     Status: None   Collection Time: 08/22/15  1:19 PM  Result Value Ref Range Status   Specimen Description BLOOD RIGHT ANTECUBITAL  Final   Special Requests BOTTLES DRAWN AEROBIC AND ANAEROBIC 5ML  Final   Culture   Final    NO GROWTH 5 DAYS Performed at [facility]    Report Status  08/27/2015 FINAL  Final  Culture, blood (routine x 2)     Status: None   Collection Time: 08/22/15  1:21 PM  Result Value Ref Range Status   Specimen Description BLOOD LEFT FOREARM  Final   Special Requests BOTTLES DRAWN AEROBIC AND ANAEROBIC 5ML  Final   Culture   Final    NO GROWTH 5 DAYS Performed at [facility]    Report Status 08/27/2015 FINAL  Final  Urine culture     Status: None   Collection Time: 08/22/15  1:34 PM  Result Value Ref Range Status   Specimen Description URINE, RANDOM  Final   Special Requests NONE  Final   Culture   Final    >=100,000 COLONIES/mL ESCHERICHIA COLI Performed at [facility]    Report Status 08/24/2015 FINAL  Final   Organism ID, Bacteria ESCHERICHIA COLI  Final      Susceptibility   Escherichia coli - MIC*    AMPICILLIN <=2 SENSITIVE Sensitive     CEFAZOLIN <=4 SENSITIVE  Sensitive     CEFTRIAXONE <=1 SENSITIVE Sensitive     CIPROFLOXACIN <=0.25 SENSITIVE Sensitive     GENTAMICIN <=1 SENSITIVE Sensitive     IMIPENEM <=0.25 SENSITIVE Sensitive     NITROFURANTOIN <=16 SENSITIVE Sensitive     TRIMETH/SULFA <=20 SENSITIVE Sensitive     AMPICILLIN/SULBACTAM <=2 SENSITIVE Sensitive     PIP/TAZO <=4 SENSITIVE Sensitive     * >=100,000 COLONIES/mL ESCHERICHIA COLI  MRSA PCR Screening     Status: None   Collection Time: 08/22/15  7:35 PM  Result Value Ref Range Status   MRSA by PCR NEGATIVE NEGATIVE Final    Comment:        The GeneXpert MRSA Assay (FDA approved for NASAL specimens only), is one component of a comprehensive MRSA colonization surveillance program. It is not intended to diagnose MRSA infection nor to guide or monitor treatment for MRSA infections.      Labs: Basic Metabolic Panel:  Recent Labs Lab 08/24/15 0310 08/25/15 0328 08/26/15 0522 08/27/15 0517 08/28/15 0510  NA 144 143 140 142 142  K 3.5 3.6 4.0 4.3 4.0  CL 120* 116* 109 112* 110  CO2 20* 23 24 25 27   GLUCOSE 91 75 118* 114* 106*  BUN 13 10 6 6 7   CREATININE 0.74 0.73 0.63 0.72 0.85  CALCIUM 7.8* 7.9* 8.1* 8.5* 8.1*  MG 1.3* 1.6* 2.0 1.7  --    Liver Function Tests:  Recent Labs Lab 08/22/15 1310 08/22/15 1946 08/23/15 0554  AST 49* 67* 62*  ALT 26 32 28  ALKPHOS 93 79 66  BILITOT 1.1 0.8 0.8  PROT 7.5 5.9* 5.4*  ALBUMIN 3.2* 2.4* 2.2*    Recent Labs Lab 08/22/15 1310  LIPASE 19   No results for input(s): AMMONIA in the last 168 hours. CBC:  Recent Labs Lab 08/22/15 1310 08/23/15 0554 08/24/15 0310 08/25/15 0328 08/26/15 0522 08/28/15 0510  WBC 22.7* 19.7* 11.9* 7.8 8.3 5.7  NEUTROABS 20.9*  --   --   --   --   --   HGB 12.4 9.8* 9.0* 9.2* 10.9* 9.0*  HCT 37.3 30.1* 28.2* 27.9* 33.2* 28.3*  MCV 89.7 90.9 91.0 88.3 88.1 91.3  PLT 94* 75* 71* 78* 90* 81*   Cardiac Enzymes:  Recent Labs Lab 08/22/15 1310  TROPONINI 0.10*    BNP: Invalid input(s): POCBNP CBG: No results for input(s): GLUCAP in the last 168 hours.  Time coordinating discharge:  Greater than 30 minutes  Signed:  Brain Honeycutt, DO [facility] Pager: [phone] 08/28/2015, 10:23 AM

## 2015-08-28 NOTE — Telephone Encounter (Signed)
TOC per Katie  4/11 @ 9 w/ Mickel Baas

## 2015-08-28 NOTE — Progress Notes (Signed)
[facility] is providing the following services: Hospital Bed - to be delivered to facility.  If patient discharges after hours, please call [phone].   Linward Headland 08/28/2015, 10:40 AM

## 2015-08-28 NOTE — Progress Notes (Signed)
Daily Progress Note   Patient Name: Nicole Randall       Date: 08/28/2015 DOB: 12-04-1945  Age: 70 y.o. MRN#: [id number] Attending Physician: Orson Eva, MD Primary Care Physician: Gildardo Cranker, DO Admit Date: 08/22/2015  Reason for Consultation/Follow-up: Establishing goals of care, Interfamily conflict and Psychosocial/spiritual support  Subjective:  -continued conversation at bedside with patient and her daughter Amado Nash regarding current medical situation, viable treatment options and decisions.   Values and GOC important to this patient and her family attempted to be elicited.  - the difference between an aggressive medical intervention path and a comfort path for this patient at this time was detailed  -questions and concerns were addressed  - there is conflict between patient and her daughter, it has been difficult for both as Shakirah transition from total independence to assisted living, leaving her comfort zone in [place].  Daughter feels under apprecaited and understandably is frustrated with her mother's non-compliance, ie continued smoking   -created space and opportunity for both to share thoughts and feelings and encouraged continuation of today's conversation     Length of Stay: 6 days  Current Medications: Scheduled Meds:  . amiodarone  400 mg Oral Daily  . amoxicillin  500 mg Oral 3 times per day  . antiseptic oral rinse  7 mL Mouth Rinse q12n4p  . atorvastatin  80 mg Oral q1800  . busPIRone  7.5 mg Oral BID  . chlorhexidine  15 mL Mouth Rinse BID  . feeding supplement (ENSURE ENLIVE)  237 mL Oral BID BM  . ferrous sulfate  325 mg Oral BID WC  . gabapentin  300 mg Oral BID  . Glycerin (Adult)  1 suppository Rectal Once  . hydrocortisone  25 mg Rectal BID  .  midodrine  5 mg Oral TID WC  . oxyCODONE-acetaminophen  1 tablet Oral TID   And  . oxyCODONE  5 mg Oral TID  . polyethylene glycol  17 g Oral Daily  . rivaroxaban  15 mg Oral Q supper  . sodium chloride flush  3 mL Intravenous Q12H  . spironolactone  12.5 mg Oral Daily  . traZODone  50 mg Oral QHS    Continuous Infusions:    PRN Meds: acetaminophen, albuterol, calcium carbonate, nitroGLYCERIN, ondansetron **OR** ondansetron (ZOFRAN) IV, polyethylene glycol, senna, traMADol  Physical Exam: Physical Exam  Constitutional: She appears cachectic. She appears ill.  Generalized weakness  Cardiovascular: Normal rate, regular rhythm and normal heart sounds.   Pulmonary/Chest: She has decreased breath sounds in the right lower field and the left lower field.  Musculoskeletal:  -generalized weakness  Skin: Skin is warm and dry.                Vital Signs: BP 107/39 mmHg  Pulse 68  Temp(Src) 98.2 F (36.8 C) (Oral)  Resp 18  Ht 5\' 3"  (1.6 m)  Wt 55.8 kg (123 lb 0.3 oz)  BMI 21.80 kg/m2  SpO2 94% SpO2: SpO2: 94 % O2 Device: O2 Device: Not Delivered O2 Flow Rate: O2 Flow Rate (L/min): 3 L/min  Intake/output summary:  Intake/Output Summary (Last 24 hours) at 08/28/15 1337 Last data filed at 08/28/15 1151  Gross per 24 hour  Intake    600 ml  Output   2150 ml  Net  -1550 ml   LBM: Last BM Date: 08/26/15 Baseline Weight: Weight: 46.72 kg (103 lb) Most recent weight: Weight: 55.8 kg (123 lb 0.3 oz)       Palliative Assessment/Data:   Additional Data Reviewed: CBC    Component Value Date/Time   WBC 5.7 08/28/2015 0510   RBC 3.10* 08/28/2015 0510   RBC 3.95 07/08/2015 1911   HGB 9.0* 08/28/2015 0510   HCT 28.3* 08/28/2015 0510   PLT 81* 08/28/2015 0510   MCV 91.3 08/28/2015 0510   MCH 29.0 08/28/2015 0510   MCHC 31.8 08/28/2015 0510   RDW 23.4* 08/28/2015 0510   LYMPHSABS 0.7 08/22/2015 1310   MONOABS 1.1* 08/22/2015 1310   EOSABS 0.0 08/22/2015 1310   BASOSABS  0.0 08/22/2015 1310    CMP     Component Value Date/Time   NA 142 08/28/2015 0510   K 4.0 08/28/2015 0510   CL 110 08/28/2015 0510   CO2 27 08/28/2015 0510   GLUCOSE 106* 08/28/2015 0510   BUN 7 08/28/2015 0510   CREATININE 0.85 08/28/2015 0510   CREATININE 0.81 07/27/2015 1201   CALCIUM 8.1* 08/28/2015 0510   PROT 5.4* 08/23/2015 0554   ALBUMIN 2.2* 08/23/2015 0554   AST 62* 08/23/2015 0554   ALT 28 08/23/2015 0554   ALKPHOS 66 08/23/2015 0554   BILITOT 0.8 08/23/2015 0554   GFRNONAA >60 08/28/2015 0510   GFRAA >60 08/28/2015 0510       Problem List:  Patient Active Problem List   Diagnosis Date Noted  . Palliative care encounter 08/27/2015  . Weakness generalized 08/27/2015  . Chronic pain syndrome 08/27/2015  . DNR (do not resuscitate)   . Escherichia coli sepsis ([place]) 08/25/2015  . Acute on chronic systolic CHF (congestive heart failure) ([place]) 08/24/2015  . Blood per rectum   . Demand ischemia ([place])   . Emesis   . Sepsis ([place]) 08/22/2015  . Chronic systolic CHF (congestive heart failure) ([place]) 08/22/2015  . UTI (lower urinary tract infection) 08/22/2015  . Absolute anemia   . Protein-calorie malnutrition, severe 07/05/2015  . UGI bleed 07/04/2015  . Symptomatic anemia 07/04/2015  . Systolic CHF ([place]) [id number]  . HTN (hypertension) 07/04/2015  . CAD (coronary artery disease) 07/04/2015  . PAF (paroxysmal atrial fibrillation) ([place]) 07/04/2015  . Stage I pressure ulcer of sacral region 07/04/2015  . Depression 07/04/2015  . Constipation 07/04/2015  . OSA on CPAP 07/04/2015  . Hyperlipidemia 07/04/2015     Palliative Care Assessment & Plan    Code Status:  DNR    Code Status Orders        Start     Ordered   08/25/15 1635  Do not attempt resuscitation (DNR)   Continuous    Question Answer Comment  In the event of cardiac or respiratory ARREST Do not call a "code blue"   In the event of cardiac or respiratory ARREST Do not perform  Intubation, CPR, defibrillation or ACLS   In the event of cardiac or respiratory ARREST Use medication by any route, position, wound care, and other measures to relive pain and suffering. May use oxygen, suction and manual treatment of airway obstruction as needed for comfort.      08/25/15 1634    Code Status History    Date Active Date Inactive Code Status Order ID Comments User Context   08/22/2015  7:33 PM 08/25/2015  4:34 PM Full Code [id number]  Orson Eva, MD Inpatient   07/04/2015  8:09 PM 07/09/2015  8:13 PM Full Code [id number]  Reubin Milan, MD ED        Goals of Care/Additional Recommendations:  At this time patient wishes to continue with current treatment plan, she is considering a referral to a vascular interventionist later in April.  Lurline is not in a place to shift to a full comfort path.   Psycho-social Needs: Education on Hospice-- we discussed hospice benefit and self referral process    Symptom Management:      1.  Weakness: continue PT on discharge     Discharge Planning:  Assisted Living with [facility] to follow   Care plan was discussed with Dr Tat  Thank you for allowing the Palliative Medicine Team to assist in the care of this patient.   Time In: 0800 Time Out: 0930 Total Time 90 min Prolonged Time Billed  no         Knox Royalty, NP  08/28/2015, 1:37 PM  Please contact Palliative Medicine Team phone at [phone] for questions and concerns.

## 2015-08-28 NOTE — NC FL2 (Signed)
[place] LEVEL OF CARE SCREENING TOOL     IDENTIFICATION  Patient Name: Nicole Randall Birthdate: Oct 09, 1945 Sex: female Admission Date (Current Location): 08/22/2015  [facility] and [place] Number:  Herbalist and Address:  [facility],  [place], [place]      Provider Number: [id number]  Attending Physician Name and Address:  Orson Eva, MD  Relative Name and Phone Number:       Current Level of Care: Hospital Recommended Level of Care: [place] Prior Approval Number:    Date Approved/Denied:   PASRR Number: [id number]  Discharge Plan:  (Return to ALF)    Current Diagnoses: Patient Active Problem List   Diagnosis Date Noted  . Palliative care encounter 08/27/2015  . Weakness generalized 08/27/2015  . Chronic pain syndrome 08/27/2015  . DNR (do not resuscitate)   . Escherichia coli sepsis ([place]) 08/25/2015  . Acute on chronic systolic CHF (congestive heart failure) ([place]) 08/24/2015  . Blood per rectum   . Demand ischemia ([place])   . Emesis   . Sepsis ([place]) 08/22/2015  . Chronic systolic CHF (congestive heart failure) ([place]) 08/22/2015  . UTI (lower urinary tract infection) 08/22/2015  . Absolute anemia   . Protein-calorie malnutrition, severe 07/05/2015  . UGI bleed 07/04/2015  . Symptomatic anemia 07/04/2015  . Systolic CHF ([place]) 123XX123  . HTN (hypertension) 07/04/2015  . CAD (coronary artery disease) 07/04/2015  . PAF (paroxysmal atrial fibrillation) ([place]) 07/04/2015  . Stage I pressure ulcer of sacral region 07/04/2015  . Depression 07/04/2015  . Constipation 07/04/2015  . OSA on CPAP 07/04/2015  . Hyperlipidemia 07/04/2015    Orientation RESPIRATION BLADDER Height & Weight     Self, Time, Situation, Place  Normal Continent Weight: 123 lb 0.3 oz (55.8 kg) Height:  5\' 3"  (160 cm)  BEHAVIORAL SYMPTOMS/MOOD NEUROLOGICAL BOWEL NUTRITION STATUS      Continent Diet (low sodium heart  healthy )  AMBULATORY STATUS COMMUNICATION OF NEEDS Skin   Supervision Verbally                         Personal Care Assistance Level of Assistance  Bathing, Dressing Bathing Assistance: Limited assistance   Dressing Assistance: Limited assistance     Functional Limitations Info             SPECIAL CARE FACTORS FREQUENCY  PT (By licensed PT), OT (By licensed OT)                    Contractures      Additional Factors Info  Code Status, Allergies Code Status Info: DNR Allergies Info: Eliquis            Discharge Medications: Medication List    STOP taking these medications       potassium chloride 10 MEQ tablet  Commonly known as: K-DUR,KLOR-CON      TAKE these medications       acetaminophen 500 MG tablet  Commonly known as: TYLENOL  Take 500 mg by mouth every 6 (six) hours as needed (body pain).     albuterol (2.5 MG/3ML) 0.083% nebulizer solution  Commonly known as: PROVENTIL  Take 2.5 mg by nebulization every 6 (six) hours as needed for wheezing or shortness of breath.     amiodarone 400 MG tablet  Commonly known as: PACERONE  Take 1 tablet (400 mg total) by mouth daily.     atorvastatin 80 MG  tablet  Commonly known as: LIPITOR  Take 80 mg by mouth daily.     busPIRone 7.5 MG tablet  Commonly known as: BUSPAR  Take 7.5 mg by mouth 2 (two) times daily.     calcium carbonate 750 MG chewable tablet  Commonly known as: TUMS EX  Chew 1 tablet by mouth 3 (three) times daily as needed for heartburn.     feeding supplement (ENSURE ENLIVE) Liqd  Take 237 mLs by mouth 2 (two) times daily between meals.     ferrous sulfate 325 (65 FE) MG tablet  Take 1 tablet (325 mg total) by mouth 2 (two) times daily with a meal.     gabapentin 300 MG capsule  Commonly known as: NEURONTIN  Take 300 mg by mouth 2 (two) times daily.     hydrocortisone 25 MG suppository  Commonly  known as: ANUSOL-HC  Place 1 suppository (25 mg total) rectally 2 (two) times daily. X 5 more days     Melatonin 5 MG Caps  Take 1 capsule by mouth at bedtime as needed (sleep).     midodrine 5 MG tablet  Commonly known as: PROAMATINE  Take 5 mg by mouth 3 (three) times daily with meals.     nitroGLYCERIN 0.4 MG SL tablet  Commonly known as: NITROSTAT  Place 1 tablet (0.4 mg total) under the tongue every 5 (five) minutes as needed for chest pain. Up to 3 doses     oxyCODONE-acetaminophen 10-325 MG tablet  Commonly known as: PERCOCET  Take 1 tablet by mouth 3 (three) times daily.     polyethylene glycol packet  Commonly known as: MIRALAX / GLYCOLAX  Take 17 g by mouth daily as needed (constipation).     Rivaroxaban 15 MG Tabs tablet  Commonly known as: XARELTO  Take 15 mg by mouth daily.     senna 8.6 MG tablet  Commonly known as: SENOKOT  Take 1 tablet by mouth daily as needed for constipation.     spironolactone 25 MG tablet  Commonly known as: ALDACTONE  Take 12.5 mg by mouth daily. Take one-half tablet (12.5mg ) daily     traMADol 50 MG tablet  Commonly known as: ULTRAM  Take 1 tablet (50 mg total) by mouth every 6 (six) hours as needed. Take 1-2 tablets (50-100mg ) every 4 hours as needed for pain     traZODone 50 MG tablet  Commonly known as: DESYREL  Take 50 mg by mouth at bedtime.              Relevant Imaging Results:  Relevant Lab Results:   Additional Information SSN: [id number]  Standley Brooking, LCSW

## 2015-08-29 ENCOUNTER — Ambulatory Visit: Payer: MEDICARE | Admitting: Internal Medicine

## 2015-08-29 NOTE — Telephone Encounter (Signed)
Pt discharged 4/4.  Is at SNF- [facility]

## 2015-09-03 NOTE — Progress Notes (Signed)
Cardiology Office Note   Date:  09/05/2015   ID:  Charniece Kwiat, DOB 04-11-1946, MRN [id number]  PCP:  No primary care provider on file.  Cardiologist:  Dr. Burt Knack and Dr. Fletcher Anon for PV.      Chief Complaint  Patient presents with  . Hospitalization Follow-up    legs weeping      History of Present Illness: Nicole Randall is a 70 y.o. female who presents for post hospital.     She has a history of afib on amio increased on hospitalization.  and Xarelto, CABG, HTN, OSA on CPAP, L-CEA, MR, PAD, ICM w/ EF 35-40% by echo 06/2015.  She was initially diagnosed with afib in [place], 10/2014. S/P DCCV several times, but did not stay in SR till started on amio. Moved to [place], now sees Dr Burt Knack.   Pt developed N&V on the day of admission and came to hospital. She was febrile, SBP 90s and was given IVF. Elevated lactic acid. Sepsis felt 2nd UTI. Her troponin was elevated and she had atrial fib.   She is being seen by palliative care by is not ready for comfort path. And in hospital setting she did not want CPR but now am not sure.  She does want medical treatment.  Today 3+ lower ext edema.  With oozing.  Her lasix had been held as she has had hypotension and is on midodrine.  But with this much edema she needs the lasix.  She tries to keep her legs elevated but this does not always happen.  She does have some SOB and is still weak. She is somewhat confused which her daughter also bring up.  Some days are better than others.  But the pt did bring list of items to discuss.  1- edema see below 2- CPAP she needs new equipment will send note to Dr, Theodosia Blender nurse for assistance.      She has no chest pain.  She wanted to be back on xarelto because she did not want a stroke.   Past Medical History  Diagnosis Date  . CHF (congestive heart failure) ([place])   . Persistent atrial fibrillation ([place])   . Bone spur   . Arthritis   . Coronary artery disease     a. s/p CABG x2  .  Hypertension   . OSA on CPAP   . Impaired memory   . Depression with anxiety   . Gait disturbance   . Osteoporosis   . GI bleed   . Carotid artery disease ([place])     a. s/p L CEA  . Mitral valvular regurgitation     a. moderate to severe  . PAD (peripheral artery disease) ([facility])     a. s/p PTA of left internal iliac  . Cancer [facility])     carcinoma left leg    Past Surgical History  Procedure Laterality Date  . Bypass graft    . Back surgery    . Appendectomy    . Coronary angioplasty    . Cholecystectomy    . Tonsillectomy    . Abdominal hysterectomy      partial  . Eye surgery      bilat cataract, lasix surgery  . Breast lumpectomy Left     1980's  . Esophagogastroduodenoscopy (egd) with propofol N/A 07/06/2015    Procedure: ESOPHAGOGASTRODUODENOSCOPY (EGD) WITH PROPOFOL;  Surgeon: Mauri Pole, MD;  Location: [facility];  Service: Endoscopy;  Laterality: N/A;  . Colonoscopy with propofol  N/A 07/09/2015    Procedure: COLONOSCOPY WITH PROPOFOL;  Surgeon: Doran Stabler, MD;  Location: [facility] ENDOSCOPY;  Service: Endoscopy;  Laterality: N/A;     Current Outpatient Prescriptions  Medication Sig Dispense Refill  . acetaminophen (TYLENOL) 500 MG tablet Take 500 mg by mouth every 6 (six) hours as needed (body pain).    Marland Kitchen albuterol (PROVENTIL) (2.5 MG/3ML) 0.083% nebulizer solution Take 2.5 mg by nebulization every 6 (six) hours as needed for wheezing or shortness of breath.    Marland Kitchen amiodarone (PACERONE) 400 MG tablet Take 1 tablet (400 mg total) by mouth daily. 30 tablet 0  . atorvastatin (LIPITOR) 80 MG tablet Take 80 mg by mouth daily.    . busPIRone (BUSPAR) 7.5 MG tablet Take 7.5 mg by mouth 2 (two) times daily.    . calcium carbonate (TUMS EX) 750 MG chewable tablet Chew 1 tablet by mouth 3 (three) times daily as needed for heartburn.    . feeding supplement, ENSURE ENLIVE, (ENSURE ENLIVE) LIQD Take 237 mLs by mouth 2 (two) times daily between meals.    . ferrous sulfate  325 (65 FE) MG tablet Take 1 tablet (325 mg total) by mouth 2 (two) times daily with a meal. 60 tablet 0  . fluconazole (DIFLUCAN) 150 MG tablet Take 150 mg by mouth daily.    . furosemide (LASIX) 20 MG tablet Take 20 mg by mouth daily.    Marland Kitchen gabapentin (NEURONTIN) 300 MG capsule Take 300 mg by mouth 2 (two) times daily.     . hydrocortisone (ANUSOL-HC) 25 MG suppository Place 1 suppository (25 mg total) rectally 2 (two) times daily. X 5 more days 10 suppository 0  . Melatonin 5 MG CAPS Take 1 capsule by mouth at bedtime as needed (sleep).    . midodrine (PROAMATINE) 5 MG tablet Take 5 mg by mouth 3 (three) times daily with meals.    . nitroGLYCERIN (NITROSTAT) 0.4 MG SL tablet Place 1 tablet (0.4 mg total) under the tongue every 5 (five) minutes as needed for chest pain. Up to 3 doses 25 tablet 3  . omeprazole (PRILOSEC) 20 MG capsule Take 20 mg by mouth daily.    Marland Kitchen oxyCODONE-acetaminophen (PERCOCET) 10-325 MG tablet Take 1 tablet by mouth 3 (three) times daily. 30 tablet 0  . polyethylene glycol (MIRALAX / GLYCOLAX) packet Take 17 g by mouth daily as needed (constipation).     . potassium chloride (K-DUR,KLOR-CON) 10 MEQ tablet Take 10 mEq by mouth daily.    . Rivaroxaban (XARELTO) 15 MG TABS tablet Take 15 mg by mouth daily.     Marland Kitchen senna (SENOKOT) 8.6 MG tablet Take 1 tablet by mouth daily as needed for constipation.    Marland Kitchen spironolactone (ALDACTONE) 25 MG tablet Take 12.5 mg by mouth daily. Take one-half tablet (12.5mg )  daily    . traMADol (ULTRAM) 50 MG tablet Take 1 tablet (50 mg total) by mouth every 6 (six) hours as needed. 30 tablet 0  . traZODone (DESYREL) 50 MG tablet Take 50 mg by mouth at bedtime.     No current facility-administered medications for this visit.    Allergies:   Eliquis    Social History:  The patient  reports that she has been smoking.  She has never used smokeless tobacco. She reports that she does not drink alcohol or use illicit drugs.   Family History:  The  patient's family history includes Heart disease in her father; Lung cancer in her mother; Throat cancer  in her brother.    ROS:  General:no colds or fevers,  weight loss Skin:no rashes or ulcers, though open pores on legs with weeping.  HEENT:no blurred vision, no congestion CV:see HPI PUL:see HPI GI:no diarrhea constipation or melena, no indigestion GU:no hematuria, no dysuria MS:no joint pain, no claudication Neuro:no syncope, no lightheadedness Endo:no diabetes, no thyroid disease  Wt Readings from Last 3 Encounters:  09/04/15 118 lb 12.8 oz (53.887 kg)  08/25/15 123 lb 0.3 oz (55.8 kg)  07/27/15 107 lb 12.8 oz (48.898 kg)     PHYSICAL EXAM: VS:  BP 126/62 mmHg  Pulse 60  Ht 5\' 3"  (1.6 m)  Wt 118 lb 12.8 oz (53.887 kg)  BMI 21.05 kg/m2  SpO2 98% , BMI Body mass index is 21.05 kg/(m^2). General:Pleasant affect, NAD Skin:Warm and dry, brisk capillary refill HEENT:normocephalic, sclera clear, mucus membranes moist Neck:supple, no JVD, + bruits  Heart:S1S2 RRR with 2/6 systolic murmur,no  gallup, rub or click Lungs:clear without rales, rhonchi, or wheezes [id number], non tender, + BS, do not palpate liver spleen or masses Ext:3+ lower ext edema bil to knees, 2+ pedal pulses, 2+ radial pulses Neuro:alert and oriented x 3, easily distracted. MAE, follows commands, + facial symmetry    EKG:  EKG is ordered today. The ekg ordered today demonstrates SR q wave in II,III, AVF and no acute changes from previous EKG   Recent Labs: 08/23/2015: ALT 28 09/04/2015: BUN 8; Creat 0.81; Hemoglobin 10.3*; Magnesium 1.6; Platelets 154; Potassium 4.3; Sodium 141    Lipid Panel No results found for: CHOL, TRIG, HDL, CHOLHDL, VLDL, LDLCALC, LDLDIRECT     Other studies Reviewed: Additional studies/ records that were reviewed today include: . ECHO: 06/2015 Study Conclusions  - Left ventricle: The cavity size was normal. Wall thickness was  normal. Systolic function was moderately  reduced. The estimated  ejection fraction was in the range of 35% to 40%. There is  akinesis of the apicalanteroseptal, inferoseptal, and apical  myocardium. - Aortic valve: Trileaflet; mildly thickened, moderately calcified  leaflets. - Mitral valve: Calcified annulus. Mildly thickened leaflets .  There was moderate regurgitation. - Left atrium: The atrium was severely dilated. - Pulmonary arteries: Systolic pressure was mildly increased. PA  peak pressure: 36 mm Hg (S).  Impressions:  - EF has increased since prior study (35%)   ASSESSMENT AND PLAN:  1 Rhythm Intermitt PAF On amio I would recomm increasing dose to 400 Daily She can then f/u as outpt. Xarelto on hold No further bleeding Hgb stable Back on Xarelto to go back to 200 mg daily 09/16/15 --has seen Burt Knack to discuss Watchman but any procedure would be high risk.  2 Acute on chronic systolic CHF now with increased edema, she will continue lasix 20 - I am not sure this will control her edema.  She may need admit for IV lasix but we will try the po. Just may be difficult with her hypotension. Checked CBC, BMP and Mg. Today.   3 CAD Remote CABG No active ischemia ON statin  4 Hypotension improved on midodrine but needing lasix  5. OSA needs new supplies will send not to Dr. Radford Pax and Valetta Fuller.  6.  PAD, encouraged pt to see Dr. Fletcher Anon next week- at least for opinion and he can recheck her edema.    7. Carotid disease     Heterogeneous plaque, bilaterally. 40-59% RICA stenosis. [id number] LICA stenosis, s/p CEA with DPA. >50% bilateral ECA stenosis. Normal subclavian arteries, bilaterally. Patent vertebral  arteries with antegrade flow She has appt with Dr. Burt Knack in June but may need to be seen prior to that   Current medicines are reviewed with the patient today.  The patient Has no concerns regarding medicines.  The following changes have been made:  See above Labs/ tests ordered today  include:see above  Disposition:   FU:  see above  Lennie Muckle, NP  09/05/2015 4:27 PM    [facility] [place], [place], [place], [place] Phone: [phone]; Fax: [phone]

## 2015-09-04 ENCOUNTER — Ambulatory Visit (INDEPENDENT_AMBULATORY_CARE_PROVIDER_SITE_OTHER): Payer: MEDICARE | Admitting: Cardiology

## 2015-09-04 ENCOUNTER — Encounter: Payer: Self-pay | Admitting: Cardiology

## 2015-09-04 VITALS — BP 126/62 | HR 60 | Ht 63.0 in | Wt 118.8 lb

## 2015-09-04 DIAGNOSIS — I2510 Atherosclerotic heart disease of native coronary artery without angina pectoris: Secondary | ICD-10-CM | POA: Diagnosis not present

## 2015-09-04 DIAGNOSIS — I6529 Occlusion and stenosis of unspecified carotid artery: Secondary | ICD-10-CM

## 2015-09-04 DIAGNOSIS — I5023 Acute on chronic systolic (congestive) heart failure: Secondary | ICD-10-CM

## 2015-09-04 DIAGNOSIS — I480 Paroxysmal atrial fibrillation: Secondary | ICD-10-CM

## 2015-09-04 DIAGNOSIS — K922 Gastrointestinal hemorrhage, unspecified: Secondary | ICD-10-CM

## 2015-09-04 DIAGNOSIS — I739 Peripheral vascular disease, unspecified: Secondary | ICD-10-CM

## 2015-09-04 DIAGNOSIS — D649 Anemia, unspecified: Secondary | ICD-10-CM

## 2015-09-04 LAB — CBC
HEMATOCRIT: 32.5 % — AB (ref 35.0–45.0)
Hemoglobin: 10.3 g/dL — ABNORMAL LOW (ref 11.7–15.5)
MCH: 29.3 pg (ref 27.0–33.0)
MCHC: 31.7 g/dL — AB (ref 32.0–36.0)
MCV: 92.6 fL (ref 80.0–100.0)
MPV: 10.8 fL (ref 7.5–12.5)
Platelets: 154 10*3/uL (ref 140–400)
RBC: 3.51 MIL/uL — ABNORMAL LOW (ref 3.80–5.10)
RDW: 23.2 % — AB (ref 11.0–15.0)
WBC: 6.7 10*3/uL (ref 3.8–10.8)

## 2015-09-04 LAB — BASIC METABOLIC PANEL
BUN: 8 mg/dL (ref 7–25)
CALCIUM: 8.8 mg/dL (ref 8.6–10.4)
CO2: 31 mmol/L (ref 20–31)
CREATININE: 0.81 mg/dL (ref 0.60–0.93)
Chloride: 102 mmol/L (ref 98–110)
GLUCOSE: 137 mg/dL — AB (ref 65–99)
Potassium: 4.3 mmol/L (ref 3.5–5.3)
SODIUM: 141 mmol/L (ref 135–146)

## 2015-09-04 LAB — MAGNESIUM: Magnesium: 1.6 mg/dL (ref 1.5–2.5)

## 2015-09-04 NOTE — Patient Instructions (Addendum)
Medication Instructions:  Your physician recommends that you continue on your current medications as directed. Please refer to the Current Medication list given to you today.   Labwork: TODAY:  CBC, MAG, & BMET  Testing/Procedures: None ordered  Follow-Up: Your physician recommends that you schedule a follow-up appointment in: KEEP ALL APPOINTMENTS AS SCHEDULED   Any Other Special Instructions Will Be Listed Below (If Applicable).     If you need a refill on your cardiac medications before your next appointment, please call your pharmacy.

## 2015-09-05 ENCOUNTER — Telehealth: Payer: Self-pay | Admitting: Cardiovascular Disease

## 2015-09-05 NOTE — Telephone Encounter (Signed)
I spoke with April and she is currently with the pt.  She said the pt's legs are swollen and weeping.  Prior to hospitalization the pt's legs were being wrapped.  April was going to wrap the pt's legs today but the pt made her aware of blockage in her legs.  April contacted the office because she wanted to determine the extent of vascular disease. I reviewed PV studies with April and the pt is scheduled for PV consult on 09/11/15 with Dr Fletcher Anon. She will go ahead and wrap the pt's legs today.  She said that the pt's Furosemide was restarted yesterday from her office visit with Cecilie Kicks NP.  I advised that the pt bring an up to date list of medication to her appointment next week so that we can made recommendations if needed.

## 2015-09-05 NOTE — Telephone Encounter (Signed)
New Message:  Pt is calling in stating that the pt is having a lot of swelling and weeping on her legs and she would like to be advised on what to do.

## 2015-09-05 NOTE — Telephone Encounter (Signed)
Nicole Randall called the office again because she wanted to make Nicole Randall aware that the pt has about 8 open areas on her arms and at least 4 of them are oozing blood.  She did place bandages on these areas and was unsure if the pt had picked the scabs off these areas.  She said the pt has very thin skin and the slightest trauma causes her skin to tear. Nicole Randall did contact the PCP with this information as well.  We will evaluate the pt further on Tuesday.

## 2015-09-11 ENCOUNTER — Encounter: Payer: Self-pay | Admitting: Cardiovascular Disease

## 2015-09-11 ENCOUNTER — Ambulatory Visit (INDEPENDENT_AMBULATORY_CARE_PROVIDER_SITE_OTHER): Payer: MEDICARE | Admitting: Cardiovascular Disease

## 2015-09-11 VITALS — BP 109/46 | HR 62 | Ht 62.0 in | Wt 111.6 lb

## 2015-09-11 DIAGNOSIS — I739 Peripheral vascular disease, unspecified: Secondary | ICD-10-CM | POA: Diagnosis not present

## 2015-09-11 DIAGNOSIS — I6529 Occlusion and stenosis of unspecified carotid artery: Secondary | ICD-10-CM

## 2015-09-11 NOTE — Progress Notes (Signed)
Cardiology Office Note   Date:  09/11/2015   ID:  Nicole Randall, DOB 01-09-1946, MRN [id number]  PCP:  No primary care provider on file.  Cardiologist:  Dr. Burt Knack.   Chief Complaint  Patient presents with  . New PV Consult    per Dr. Brooks Sailors PV studies  pt c/o mild SOB, random dizziness, and severe swelling in legs/feet/ankles--pt states doing better since they wrapped her legs      History of Present Illness: Nicole Randall is a 70 y.o. female who was referred by Dr. Burt Knack for evaluation of PAD.  She has known history of atrial fibrillation, coronary artery disease status post CABG, hypertension, obstructive sleep apnea, carotid disease, chronic systolic heart failure and peripheral arterial disease status post endovascular intervention on her aortoiliac arteries in 1994 with no records available.  The patient was hospitalized recently for sepsis. She is very frail and was seen by palliative care. She is now under hospice care since Sunday. She complains of significant bilateral leg edema with difficulty in significant diuresis due to hypotension.  She complains of bilateral thigh and calf pain with walking short distance which has been happening for few months. Her mobility is very limited and she tries to walk with a walker. Biggest issue seems to be leg edema which is currently improving with wrapping her legs  Noninvasive vascular evaluation showed normal ABI but significant distal aorta and bilateral iliac disease.     Past Medical History  Diagnosis Date  . CHF (congestive heart failure) (HCC)   . Persistent atrial fibrillation (HCC)   . Bone spur   . Arthritis   . Coronary artery disease     a. s/p CABG x2  . Hypertension   . OSA on CPAP   . Impaired memory   . Depression with anxiety   . Gait disturbance   . Osteoporosis   . GI bleed   . Carotid artery disease (HCC)     a. s/p L CEA  . Mitral valvular regurgitation     a. moderate to severe    . PAD (peripheral artery disease) (HCC)     a. s/p PTA of left internal iliac  . Cancer (HCC)     carcinoma left leg    Past Surgical History  Procedure Laterality Date  . Bypass graft    . Back surgery    . Appendectomy    . Coronary angioplasty    . Cholecystectomy    . Tonsillectomy    . Abdominal hysterectomy      partial  . Eye surgery      bilat cataract, lasix surgery  . Breast lumpectomy Left     19 80's  . Esophagogastroduodenoscopy (egd) with propofol N/A 07/06/2015    Procedure: ESOPHAGOGASTRODUODENOSCOPY (EGD) WITH PROPOFOL;  Surgeon: Mauri Pole, MD;  Location: [facility];  Service: Endoscopy;  Laterality: N/A;  . Colonoscopy with propofol N/A 07/09/2015    Procedure: COLONOSCOPY WITH PROPOFOL;  Surgeon: Doran Stabler, MD;  Location: [facility] ENDOSCOPY;  Service: Endoscopy;  Laterality: N/A;     Current Outpatient Prescriptions  Medication Sig Dispense Refill  . acetaminophen (TYLENOL) 500 MG tablet Take 500 mg by mouth every 6 (six) hours as needed (body pain).    Marland Kitchen albuterol (PROVENTIL) (2.5 MG/3ML) 0.083% nebulizer solution Take 2.5 mg by nebulization every 6 (six) hours as needed for wheezing or shortness of breath.    Marland Kitchen amiodarone (PACERONE) 400 MG tablet Take 1 tablet (400 mg  total) by mouth daily. 30 tablet 0  . atorvastatin (LIPITOR) 80 MG tablet Take 80 mg by mouth daily.    . busPIRone (BUSPAR) 7.5 MG tablet Take 7.5 mg by mouth 2 (two) times daily.    . calcium carbonate (TUMS EX) 750 MG chewable tablet Chew 1 tablet by mouth 3 (three) times daily as needed for heartburn.    . feeding supplement, ENSURE ENLIVE, (ENSURE ENLIVE) LIQD Take 237 mLs by mouth 2 (two) times daily between meals.    . ferrous sulfate 325 (65 FE) MG tablet Take 1 tablet (325 mg total) by mouth 2 (two) times daily with a meal. 60 tablet 0  . fluconazole (DIFLUCAN) 150 MG tablet Take 150 mg by mouth as needed.     . furosemide (LASIX) 20 MG tablet Take 20 mg by mouth daily.     Marland Kitchen gabapentin (NEURONTIN) 300 MG capsule Take 300 mg by mouth 2 (two) times daily.     . hydrocortisone (ANUSOL-HC) 25 MG suppository Place 1 suppository (25 mg total) rectally 2 (two) times daily. X 5 more days 10 suppository 0  . Melatonin 5 MG CAPS Take 1 capsule by mouth at bedtime as needed (sleep).    . midodrine (PROAMATINE) 5 MG tablet Take 5 mg by mouth 3 (three) times daily with meals.    . nitroGLYCERIN (NITROSTAT) 0.4 MG SL tablet Place 1 tablet (0.4 mg total) under the tongue every 5 (five) minutes as needed for chest pain. Up to 3 doses 25 tablet 3  . omeprazole (PRILOSEC) 20 MG capsule Take 20 mg by mouth daily.    Marland Kitchen oxyCODONE-acetaminophen (PERCOCET) 10-325 MG tablet Take 1 tablet by mouth 3 (three) times daily. 30 tablet 0  . polyethylene glycol (MIRALAX / GLYCOLAX) packet Take 17 g by mouth daily as needed (constipation).     . potassium chloride (K-DUR,KLOR-CON) 10 MEQ tablet Take 10 mEq by mouth daily.    . Rivaroxaban (XARELTO) 15 MG TABS tablet Take 15 mg by mouth daily.     Marland Kitchen senna (SENOKOT) 8.6 MG tablet Take 1 tablet by mouth daily as needed for constipation.    Marland Kitchen spironolactone (ALDACTONE) 25 MG tablet Take 12.5 mg by mouth daily. Take one-half tablet (12.5mg )  daily    . traMADol (ULTRAM) 50 MG tablet Take 1 tablet (50 mg total) by mouth every 6 (six) hours as needed. 30 tablet 0  . traZODone (DESYREL) 50 MG tablet Take 50 mg by mouth at bedtime.     No current facility-administered medications for this visit.    Allergies:   Eliquis    Social History:  The patient  reports that she has been smoking.  She has never used smokeless tobacco. She reports that she does not drink alcohol or use illicit drugs.   Family History:  The patient's family history includes Heart disease in her father; Lung cancer in her mother; Throat cancer in her brother.    ROS:  Please see the history of present illness.   Otherwise, review of systems are positive for none.   All other  systems are reviewed and negative.    PHYSICAL EXAM: VS:  BP 109/46 mmHg  Pulse 62  Ht 5\' 2"  (1.575 m)  Wt 111 lb 9.6 oz (50.621 kg)  BMI 20.41 kg/m2 , BMI Body mass index is 20.41 kg/(m^2). GEN: Frail looking HEENT: normal Neck: no JVD, carotid bruits, or masses Cardiac:  RRR, no murmurs, rubs, or gallops.both legs are wrapped  Respiratory:  clear to auscultation bilaterally, normal work of breathing GI: soft, nontender, nondistended, + BS MS: no deformity or atrophy Skin: warm and dry, no rash Neuro:  Strength and sensation are intact Psych: euthymic mood, full affect     EKG:  EKG is not ordered today.    Recent Labs: 08/23/2015: ALT 28 09/04/2015: BUN 8; Creat 0.81; Hemoglobin 10.3*; Magnesium 1.6; Platelets 154; Potassium 4.3; Sodium 141    Lipid Panel No results found for: CHOL, TRIG, HDL, CHOLHDL, VLDL, LDLCALC, LDLDIRECT    Wt Readings from Last 3 Encounters:  09/11/15 111 lb 9.6 oz (50.621 kg)  09/04/15 118 lb 12.8 oz (53.887 kg)  08/25/15 123 lb 0.3 oz (55.8 kg)        ASSESSMENT AND PLAN:  1.  Peripheral arterial disease: The patient does have claudication with evidence of aortoiliac disease bilaterally. She might benefit from endovascular intervention. However, she is severely limited by other multiple comorbidities and currently she is under hospice care recovering from recent sepsis. I'm going to bring her back in 3 months for reevaluation to see if her functional capacity improves. In that situation, we can proceed with angiography and possible endovascular intervention.  2. Chronic systolic heart failure: Difficult to increase furosemide which was complicated by hypotension in the past. Leg edema seems to be improving with wrapping her legs. Continue current dose of furosemide 20 mg daily.   Disposition:   FU with me in 3 months  Signed,  Kathlyn Sacramento, MD  09/11/2015 10:46 AM    [place]

## 2015-09-11 NOTE — Patient Instructions (Signed)
Medication Instructions:  Your physician recommends that you continue on your current medications as directed. Please refer to the Current Medication list given to you today.  Labwork: No new orders.   Testing/Procedures: No new orders.   Follow-Up: Your physician recommends that you schedule a follow-up appointment in: 3 MONTHS with Dr Arida   Any Other Special Instructions Will Be Listed Below (If Applicable).     If you need a refill on your cardiac medications before your next appointment, please call your pharmacy.   

## 2015-09-13 ENCOUNTER — Telehealth (HOSPITAL_COMMUNITY): Payer: Self-pay | Admitting: Vascular Surgery

## 2015-09-13 ENCOUNTER — Telehealth: Payer: Self-pay | Admitting: Cardiovascular Disease

## 2015-09-13 NOTE — Telephone Encounter (Signed)
Pt called because she said the someone called her from here . Pt was made aware that according to our records is no records of a phone call documented in her records. Pt states she had an appointment today that she may have missed. I went over all the appointment in her records. Pt has had no appointment for today. She is aware that she has an appointment, with the [facility] tomorrow. Phone number given.

## 2015-09-13 NOTE — Telephone Encounter (Signed)
New  Message ° °Pt returned call  °

## 2015-09-13 NOTE — Telephone Encounter (Signed)
Left pt message tio make new pt appt w/ md

## 2015-09-14 ENCOUNTER — Ambulatory Visit (INDEPENDENT_AMBULATORY_CARE_PROVIDER_SITE_OTHER): Payer: MEDICARE | Admitting: Gastroenterology

## 2015-09-14 ENCOUNTER — Encounter: Payer: Self-pay | Admitting: Gastroenterology

## 2015-09-14 VITALS — BP 104/46 | HR 68 | Ht 62.0 in | Wt 113.0 lb

## 2015-09-14 DIAGNOSIS — I6529 Occlusion and stenosis of unspecified carotid artery: Secondary | ICD-10-CM

## 2015-09-14 DIAGNOSIS — K3184 Gastroparesis: Secondary | ICD-10-CM | POA: Diagnosis not present

## 2015-09-14 DIAGNOSIS — R112 Nausea with vomiting, unspecified: Secondary | ICD-10-CM

## 2015-09-14 DIAGNOSIS — K219 Gastro-esophageal reflux disease without esophagitis: Secondary | ICD-10-CM

## 2015-09-14 DIAGNOSIS — K5900 Constipation, unspecified: Secondary | ICD-10-CM | POA: Diagnosis not present

## 2015-09-14 NOTE — Patient Instructions (Signed)
Gastroparesis diet given  Do Step #3 in diet book Increase Ensure to twice daily Continue omeprazole Continue Miralax Avoid high fat and high fiber diet IV iron  Infusion every month and CBC check every month Use Benefiber 1 tablespoon daily for 1 week and then increase to twice daily

## 2015-09-14 NOTE — Progress Notes (Signed)
Nicole Randall    [id number]    1946-05-14  Primary Care Physician:No primary care provider on file.  Referring Physician: No referring provider defined for this encounter.  Chief complaint:  Nausea and vomiting   HPI: 70 year old female with multiple comorbidities here for follow-up visit after hospital discharge. She was recently admitted with sepsis a few weeks ago and also has severe congestive heart failure. She reports having nausea almost daily and vomits undigested food mostly after dinner. She feels fall throughout the day and is also losing some weight. She is currently drinking one boost a day. She is at an assisted living facility and is in hospice care.Denies any abdominal pain, melena or bright red blood per rectum.    Outpatient Encounter Prescriptions as of 09/14/2015  Medication Sig  . acetaminophen (TYLENOL) 500 MG tablet Take 500 mg by mouth every 6 (six) hours as needed (body pain).  Marland Kitchen albuterol (PROVENTIL) (2.5 MG/3ML) 0.083% nebulizer solution Take 2.5 mg by nebulization every 6 (six) hours as needed for wheezing or shortness of breath.  Marland Kitchen amiodarone (PACERONE) 400 MG tablet Take 1 tablet (400 mg total) by mouth daily.  Marland Kitchen atorvastatin (LIPITOR) 80 MG tablet Take 80 mg by mouth daily.  . busPIRone (BUSPAR) 7.5 MG tablet Take 7.5 mg by mouth 2 (two) times daily.  . calcium carbonate (TUMS EX) 750 MG chewable tablet Chew 1 tablet by mouth 3 (three) times daily as needed for heartburn.  . feeding supplement, ENSURE ENLIVE, (ENSURE ENLIVE) LIQD Take 237 mLs by mouth 2 (two) times daily between meals.  . ferrous sulfate 325 (65 FE) MG tablet Take 1 tablet (325 mg total) by mouth 2 (two) times daily with a meal.  . furosemide (LASIX) 20 MG tablet Take 20 mg by mouth daily.  Marland Kitchen gabapentin (NEURONTIN) 300 MG capsule Take 300 mg by mouth 2 (two) times daily.   . hydrocortisone (ANUSOL-HC) 25 MG suppository Place 1 suppository (25 mg total) rectally 2 (two) times  daily. X 5 more days  . Melatonin 5 MG CAPS Take 1 capsule by mouth at bedtime as needed (sleep).  . midodrine (PROAMATINE) 5 MG tablet Take 5 mg by mouth 3 (three) times daily with meals.  . mirabegron ER (MYRBETRIQ) 25 MG TB24 tablet Take 25 mg by mouth daily.  Marland Kitchen omeprazole (PRILOSEC) 20 MG capsule Take 20 mg by mouth daily.  Marland Kitchen oxyCODONE-acetaminophen (PERCOCET) 10-325 MG tablet Take 1 tablet by mouth 3 (three) times daily.  . polyethylene glycol (MIRALAX / GLYCOLAX) packet Take 17 g by mouth daily as needed (constipation).   . potassium chloride (K-DUR,KLOR-CON) 10 MEQ tablet Take 10 mEq by mouth daily.  Marland Kitchen senna (SENOKOT) 8.6 MG tablet Take 1 tablet by mouth daily as needed for constipation.  . traMADol (ULTRAM) 50 MG tablet Take 1 tablet (50 mg total) by mouth every 6 (six) hours as needed.  . traZODone (DESYREL) 50 MG tablet Take 50 mg by mouth at bedtime.  . fluconazole (DIFLUCAN) 150 MG tablet Take 150 mg by mouth as needed. Reported on 09/14/2015  . nitroGLYCERIN (NITROSTAT) 0.4 MG SL tablet Place 1 tablet (0.4 mg total) under the tongue every 5 (five) minutes as needed for chest pain. Up to 3 doses (Patient not taking: Reported on 09/14/2015)  . Rivaroxaban (XARELTO) 15 MG TABS tablet Take 15 mg by mouth daily.   . [DISCONTINUED] spironolactone (ALDACTONE) 25 MG tablet Take 12.5 mg by mouth daily. Take  one-half tablet (12.5mg )  daily   No facility-administered encounter medications on file as of 09/14/2015.    Allergies as of 09/14/2015 - Review Complete 09/14/2015  Allergen Reaction Noted  . Eliquis [apixaban] Nausea And Vomiting and Rash 08/27/2015    Past Medical History  Diagnosis Date  . CHF (congestive heart failure) ([place])   . Persistent atrial fibrillation ([place])   . Bone spur   . Arthritis   . Coronary artery disease     a. s/p CABG x2  . Hypertension   . OSA on CPAP   . Impaired memory   . Depression with anxiety   . Gait disturbance   . Osteoporosis   . GI bleed     . Carotid artery disease ([place])     a. s/p L CEA  . Mitral valvular regurgitation     a. moderate to severe  . PAD (peripheral artery disease) (HCC)     a. s/p PTA of left internal iliac  . Cancer (HCC)     carcinoma left leg  . Diverticulosis   . Gastroparesis     Past Surgical History  Procedure Laterality Date  . Bypass graft    . Back surgery    . Appendectomy    . Coronary angioplasty    . Cholecystectomy    . Tonsillectomy    . Abdominal hysterectomy      partial  . Eye surgery      bilat cataract, lasix surgery  . Breast lumpectomy Left     1980's  . Esophagogastroduodenoscopy (egd) with propofol N/A 07/06/2015    Procedure: ESOPHAGOGASTRODUODENOSCOPY (EGD) WITH PROPOFOL;  Surgeon: Mauri Pole, MD;  Location: [facility]COPY;  Service: Endoscopy;  Laterality: N/A;  . Colonoscopy with propofol N/A 07/09/2015    Procedure: COLONOSCOPY WITH PROPOFOL;  Surgeon: Doran Stabler, MD;  Location: [facility] ENDOSCOPY;  Service: Endoscopy;  Laterality: N/A;    Family History  Problem Relation Age of Onset  . Lung cancer Mother   . Heart disease Father   . Throat cancer Brother     Social History   Social History  . Marital Status: Widowed    Spouse Name: N/A  . Number of Children: 2  . Years of Education: N/A   Occupational History  . Retired.    Social History Main Topics  . Smoking status: Current Some Day Smoker -- 0.20 packs/day  . Smokeless tobacco: Never Used  . Alcohol Use: No  . Drug Use: No  . Sexual Activity: Not on file   Other Topics Concern  . Not on file   Social History Narrative   Lives in [place]. Daughter is nearby.      Review of systems: Review of Systems  Constitutional: Negative for fever and chills.  HENT: Negative.   Eyes: Negative for blurred vision.  Respiratory: Negative for cough, shortness of breath and wheezing.   Cardiovascular: Negative for chest pain and palpitations.  Gastrointestinal: as per  HPI Genitourinary: Negative for dysuria, urgency, frequency and hematuria.  Musculoskeletal: Negative for myalgias, back pain and joint pain.  Skin: Negative for itching and rash.  Neurological: Negative for dizziness, tremors, focal weakness, seizures and loss of consciousness.  Endo/Heme/Allergies: Negative for environmental allergies.  Psychiatric/Behavioral: Negative for depression, suicidal ideas and hallucinations.  All other systems reviewed and are negative.   Physical Exam: Filed Vitals:   09/14/15 1045  BP: 104/46  Pulse: 68   Gen:      No  acute distress, Frail appearing, in wheelchair HEENT:  EOMI, sclera anicteric Neck:     No masses; no thyromegaly Lungs:    Clear to auscultation bilaterally; normal respiratory effort CV:         Irregular rate and rhythm;  Abd:      + bowel sounds; soft, non-tender; no palpable masses, no distension Ext:    No edema; adequate peripheral perfusion Skin:      Warm and dry; no rash Neuro: alert and oriented x 3 Psych: normal mood and affect  Data Reviewed: Reviwed chart in epic    Assessment and Plan/Recommendations: 70 year old female with multiple comorbidities including A. fib on anticoagulation, congestive heart failure status post recent hospitalization for sepsis and possible gastroparesis based on findings on EGD here for follow-up visit Discussed gastroparesis diet in detail with patient and her daughter Small frequent meals (4-6 meals a day) every 3-4 hours. Avoid fiber and high fat diet Increase boost or ensure to twice daily Continue PPI and antireflux measures She is having daily bowel movements with MiraLAX daily and will add Benefiber (which is soluble fiber) 1 tablespoon daily and titrate up in next 1-2 weeks if well tolerated to twice daily to prevent constipation Return as needed  K. Denzil Magnuson , [place] [phone] Mon-Fri 8a-5p [phone] after 5p, weekends, holidays

## 2015-09-25 ENCOUNTER — Ambulatory Visit (INDEPENDENT_AMBULATORY_CARE_PROVIDER_SITE_OTHER): Payer: MEDICARE | Admitting: Cardiology

## 2015-09-25 ENCOUNTER — Encounter: Payer: Self-pay | Admitting: Cardiology

## 2015-09-25 VITALS — BP 120/60 | HR 68 | Ht 62.0 in | Wt 115.8 lb

## 2015-09-25 DIAGNOSIS — I10 Essential (primary) hypertension: Secondary | ICD-10-CM | POA: Diagnosis not present

## 2015-09-25 DIAGNOSIS — G4733 Obstructive sleep apnea (adult) (pediatric): Secondary | ICD-10-CM | POA: Diagnosis not present

## 2015-09-25 DIAGNOSIS — I6529 Occlusion and stenosis of unspecified carotid artery: Secondary | ICD-10-CM | POA: Diagnosis not present

## 2015-09-25 DIAGNOSIS — Z9989 Dependence on other enabling machines and devices: Principal | ICD-10-CM

## 2015-09-25 NOTE — Progress Notes (Signed)
Cardiology Office Note    Date:  09/25/2015   ID:  Nicole Randall, DOB Sep 11, 1945, MRN [id number]  PCP:  No primary care provider on file.  Cardiologist:  Sueanne Margarita, MD   Chief Complaint  Patient presents with  . Sleep Apnea  . Hypertension    History of Present Illness:  Nicole Randall is a 70 y.o. female with a history of CHF, persistent atrial fibrillation, CAD and OSA on PAP who has not been followed by a sleep MD and now presents for evaluation of OSA because she needs new PAP supplies.  She had a sleep study done in [place] last fall that was ordered by a Pulmonologist.  Apparently she was already on O2 and then was placed on PAP but has not been following up with anyone.  She uses the PAP nightly for the most part.  She uses a nasal mask which she tolerates well.  Since going on the PAP therapy she has not noticed any improvement in how she feels in the am due to frequent awakenings at night to go to the bathroom.  She will take a nap some in the afternoon.  She is in Assisted Living.  She has some problems with mouth dryness but does not use a chin strap because she felt it was hard to use.      Past Medical History  Diagnosis Date  . CHF (congestive heart failure) ([place])   . Persistent atrial fibrillation ([place])   . Bone spur   . Arthritis   . Coronary artery disease     a. s/p CABG x2  . Hypertension   . OSA on CPAP   . Impaired memory   . Depression with anxiety   . Gait disturbance   . Osteoporosis   . GI bleed   . Carotid artery disease ([place])     a. s/p L CEA  . Mitral valvular regurgitation     a. moderate to severe  . PAD (peripheral artery disease) ([facility])     a. s/p PTA of left internal iliac  . Cancer ([facility])     carcinoma left leg  . Diverticulosis   . Gastroparesis     Past Surgical History  Procedure Laterality Date  . Bypass graft    . Back surgery    . Appendectomy    . Coronary angioplasty    . Cholecystectomy    . Tonsillectomy    .  Abdominal hysterectomy      partial  . Eye surgery      bilat cataract, lasix surgery  . Breast lumpectomy Left     1980's  . Esophagogastroduodenoscopy (egd) with propofol N/A 07/06/2015    Procedure: ESOPHAGOGASTRODUODENOSCOPY (EGD) WITH PROPOFOL;  Surgeon: Mauri Pole, MD;  Location: [facility];  Service: Endoscopy;  Laterality: N/A;  . Colonoscopy with propofol N/A 07/09/2015    Procedure: COLONOSCOPY WITH PROPOFOL;  Surgeon: Doran Stabler, MD;  Location: [facility] ENDOSCOPY;  Service: Endoscopy;  Laterality: N/A;    Current Medications: Outpatient Prescriptions Prior to Visit  Medication Sig Dispense Refill  . acetaminophen (TYLENOL) 500 MG tablet Take 500 mg by mouth every 6 (six) hours as needed (body pain).    Marland Kitchen albuterol (PROVENTIL) (2.5 MG/3ML) 0.083% nebulizer solution Take 2.5 mg by nebulization every 6 (six) hours as needed for wheezing or shortness of breath.    Marland Kitchen amiodarone (PACERONE) 400 MG tablet Take 1 tablet (400 mg total) by mouth daily. 30 tablet 0  .  atorvastatin (LIPITOR) 80 MG tablet Take 80 mg by mouth daily.    . busPIRone (BUSPAR) 7.5 MG tablet Take 7.5 mg by mouth 2 (two) times daily.    . calcium carbonate (TUMS EX) 750 MG chewable tablet Chew 1 tablet by mouth 3 (three) times daily as needed for heartburn.    . feeding supplement, ENSURE ENLIVE, (ENSURE ENLIVE) LIQD Take 237 mLs by mouth 2 (two) times daily between meals.    . ferrous sulfate 325 (65 FE) MG tablet Take 1 tablet (325 mg total) by mouth 2 (two) times daily with a meal. 60 tablet 0  . fluconazole (DIFLUCAN) 150 MG tablet Take 150 mg by mouth once as needed (yeast infections). Reported on 09/14/2015    . furosemide (LASIX) 20 MG tablet Take 20 mg by mouth daily.    Marland Kitchen gabapentin (NEURONTIN) 300 MG capsule Take 300 mg by mouth 2 (two) times daily.     . Melatonin 5 MG CAPS Take 1 capsule by mouth at bedtime as needed (sleep).    . midodrine (PROAMATINE) 5 MG tablet Take 5 mg by mouth 3 (three)  times daily with meals.    . mirabegron ER (MYRBETRIQ) 25 MG TB24 tablet Take 25 mg by mouth daily.    . nitroGLYCERIN (NITROSTAT) 0.4 MG SL tablet Place 1 tablet (0.4 mg total) under the tongue every 5 (five) minutes as needed for chest pain. Up to 3 doses 25 tablet 3  . omeprazole (PRILOSEC) 20 MG capsule Take 20 mg by mouth daily.    Marland Kitchen oxyCODONE-acetaminophen (PERCOCET) 10-325 MG tablet Take 1 tablet by mouth 3 (three) times daily. 30 tablet 0  . polyethylene glycol (MIRALAX / GLYCOLAX) packet Take 17 g by mouth daily as needed (constipation).     . potassium chloride (K-DUR,KLOR-CON) 10 MEQ tablet Take 10 mEq by mouth daily.    . Rivaroxaban (XARELTO) 15 MG TABS tablet Take 15 mg by mouth daily.     Marland Kitchen senna (SENOKOT) 8.6 MG tablet Take 1 tablet by mouth daily as needed for constipation.    . traZODone (DESYREL) 50 MG tablet Take 50 mg by mouth at bedtime.    . hydrocortisone (ANUSOL-HC) 25 MG suppository Place 1 suppository (25 mg total) rectally 2 (two) times daily. X 5 more days 10 suppository 0  . traMADol (ULTRAM) 50 MG tablet Take 1 tablet (50 mg total) by mouth every 6 (six) hours as needed. 30 tablet 0   No facility-administered medications prior to visit.     Allergies:   Eliquis   Social History   Social History  . Marital Status: Widowed    Spouse Name: N/A  . Number of Children: 2  . Years of Education: N/A   Occupational History  . Retired.    Social History Main Topics  . Smoking status: Current Some Day Smoker -- 0.20 packs/day  . Smokeless tobacco: Never Used  . Alcohol Use: No  . Drug Use: No  . Sexual Activity: Not Asked   Other Topics Concern  . None   Social History Narrative   Lives in [place]. Daughter is nearby.     Family History:  The patient's family history includes Heart disease in her father; Lung cancer in her mother; Throat cancer in her brother.   ROS:   Please see the history of present illness.    ROS All other  systems reviewed and are negative.   PHYSICAL EXAM:   VS:  BP 120/60 mmHg  Pulse 68  Ht 5\' 2"  (1.575 m)  Wt 115 lb 12.8 oz (52.527 kg)  BMI 21.17 kg/m2   GEN: Well nourished, well developed, in no acute distress HEENT: normal Neck: no JVD, carotid bruits, or masses Cardiac: RRR; no murmurs, rubs, or gallops,no edema.  Intact distal pulses bilaterally.  Respiratory:  clear to auscultation bilaterally, normal work of breathing GI: soft, nontender, nondistended, + BS MS: no deformity or atrophy Skin: warm and dry, no rash Neuro:  Alert and Oriented x 3, Strength and sensation are intact Psych: euthymic mood, full affect  Wt Readings from Last 3 Encounters:  09/25/15 115 lb 12.8 oz (52.527 kg)  09/14/15 113 lb (51.256 kg)  09/11/15 111 lb 9.6 oz (50.621 kg)      Studies/Labs Reviewed:   EKG:  EKG is not ordered today.    Recent Labs: 08/23/2015: ALT 28 09/04/2015: BUN 8; Creat 0.81; Hemoglobin 10.3*; Magnesium 1.6; Platelets 154; Potassium 4.3; Sodium 141   Lipid Panel No results found for: CHOL, TRIG, HDL, CHOLHDL, VLDL, LDLCALC, LDLDIRECT  Additional studies/ records that were reviewed today include:  None    ASSESSMENT:    1. OSA on CPAP   2. Essential hypertension      PLAN:  In order of problems listed above:  1. OSA - she is tolerating her CPAP well.  Unfortunately I do not have her old records from her sleep study.  She was followed by [facility] in [place] before so I will get a copy of her studies from there and forward to [facility] in [place] so her supplies can be ordered.  I will also get a download from her DME. 2. HTN - BP is controlled.    Followup with me in 1 year    Medication Adjustments/Labs and Tests Ordered: Current medicines are reviewed at length with the patient today.  Concerns regarding medicines are outlined above.  Medication changes, Labs and Tests ordered today are listed in the Patient Instructions below.  None Signed, Sueanne Margarita,  MD  09/25/2015 2:35 PM    [facility] [address], [place], [place]  [address] Phone: [phone]; Fax: [phone]

## 2015-09-25 NOTE — Patient Instructions (Signed)

## 2015-10-04 ENCOUNTER — Encounter: Payer: Self-pay | Admitting: Cardiology

## 2015-10-05 ENCOUNTER — Encounter (HOSPITAL_COMMUNITY): Payer: MEDICARE | Admitting: Internal Medicine

## 2015-10-05 ENCOUNTER — Encounter: Payer: Self-pay | Admitting: Cardiology

## 2015-10-08 ENCOUNTER — Other Ambulatory Visit: Payer: Self-pay | Admitting: *Deleted

## 2015-10-08 DIAGNOSIS — G4733 Obstructive sleep apnea (adult) (pediatric): Secondary | ICD-10-CM

## 2015-10-11 ENCOUNTER — Telehealth (HOSPITAL_COMMUNITY): Payer: Self-pay | Admitting: Vascular Surgery

## 2015-10-11 NOTE — Telephone Encounter (Signed)
Left 3 rd message to make pt New pt appt will send letter

## 2015-10-18 ENCOUNTER — Telehealth: Payer: Self-pay | Admitting: Cardiovascular Disease

## 2015-10-18 NOTE — Telephone Encounter (Signed)
New message      Questions about dosage in her lasix, spironoplactone and acetaminophen.  Pt lives at [facility] and her medication print out is different.  Please call

## 2015-10-18 NOTE — Telephone Encounter (Signed)
Pt states she is trying to get clarification on her medications-lasix, amiodarone, and spironolactone. Pt states current orders at [facility] are not to give lasix, amiodarone, and spironolactone if SBP <100.  Pt states she misses doses of medications about 1 time every 2 weeks. Pt states she weighs daily and her weight is stable.  Pt states she does have LE edema and is using leg wraps to help with this, it is not a new finding. Pt denies increase in shortness of breath. Pt states she is seen by a doctor at [facility] regularly.   I reviewed with Cecilie Kicks, NP Per Laura-would not change current medication orders if pt stable and no new cardiac symptoms.  Pt states she does not feel she has any new cardiac symptoms, she will continue to monitor her weight, LE edema and breathing.

## 2015-10-23 ENCOUNTER — Ambulatory Visit: Payer: MEDICARE | Admitting: Cardiology

## 2015-11-16 ENCOUNTER — Ambulatory Visit: Payer: MEDICARE | Admitting: Cardiovascular Disease

## 2015-12-11 ENCOUNTER — Ambulatory Visit: Payer: MEDICARE | Admitting: Cardiovascular Disease

## 2016-01-16 ENCOUNTER — Ambulatory Visit: Payer: MEDICARE | Admitting: Internal Medicine

## 2016-02-24 DEATH — deceased

## 2018-02-04 IMAGING — DX DG CHEST 1V PORT
1 series · 1 of 1 positions shown · non-contrast
Comparison: None.

CLINICAL DATA: 69-year-old female with sepsis.

EXAM:
PORTABLE CHEST 1 VIEW

[chest ap]
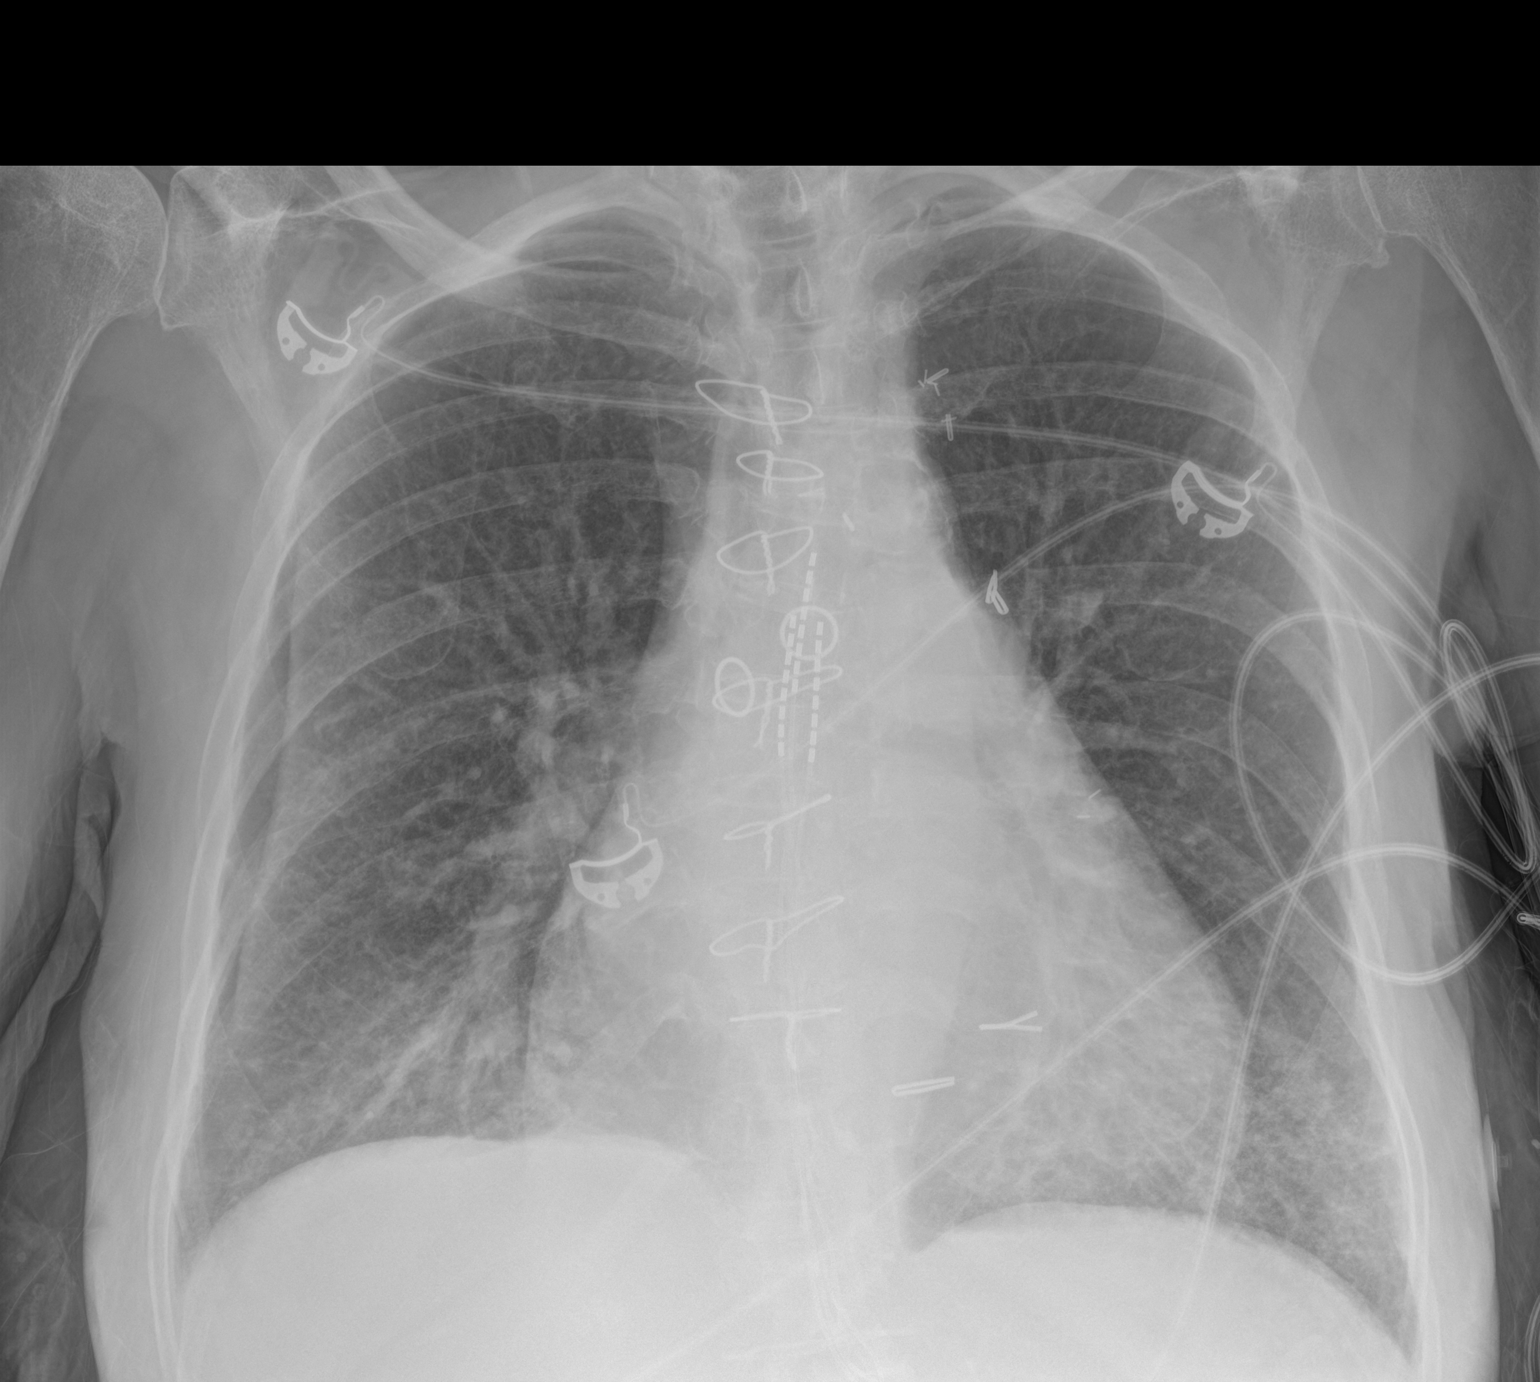

[1 of 1 positions shown; findings below may reference images not displayed]

FINDINGS: Single-view of the chest demonstrates bibasilar hazy densities which
may be atelectatic changes or represent pneumonia. There is no focal
consolidation or pleural effusion. Linear density along the lateral
aspect of the right lung most likely or skin fold. A small
pneumothorax is much less likely. Repeat radiograph recommended if
there is clinical concern for pneumothorax. There is mild
cardiomegaly. Median sternotomy wires and CABG vascular clips noted.
Spinal stimulator wires noted. No acute fracture.
IMPRESSION: Bibasilar atelectasis/infiltrate.

Skin fold artifact versus less likely a small right pneumothorax.
Repeat radiograph is recommended if there is high clinical concern
for pneumothorax.

## 2018-02-04 IMAGING — CT CT ABD-PELV W/ CM
2 of 5 series · 15 of 46 positions shown, 17 images · IV contrast (iopamidol)
Comparison: None.

CLINICAL DATA: Intractable nausea and vomiting. Leukocytosis.
Fever.

EXAM:
CT ABDOMEN AND PELVIS WITH CONTRAST
TECHNIQUE: Multidetector CT imaging of the abdomen and pelvis was performed
using the standard protocol following bolus administration of
intravenous contrast.
CONTRAST:  80mL [id number]-K88 IOPAMIDOL ([id number]-K88) INJECTION 61%

[Series 2: abd/pel with · axial · 0.66mm/px · z∈[-265,+100]mm · 12 of 83 slices shown, 14 images]
[im 5/83  soft-tissue]
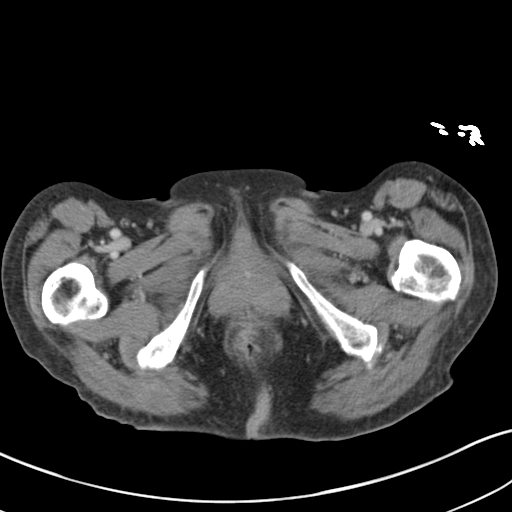
[im 5/83  bone]
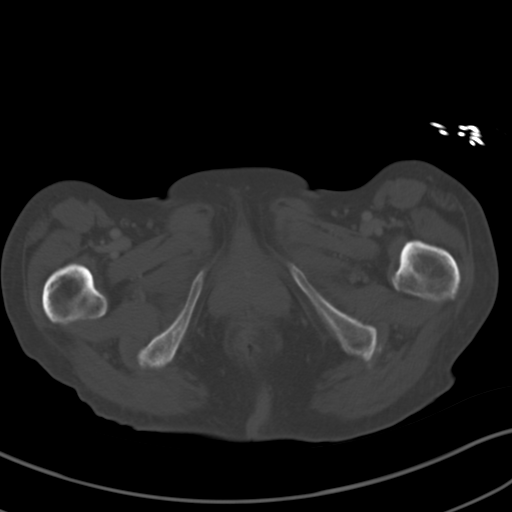
[im 14/83  soft-tissue]
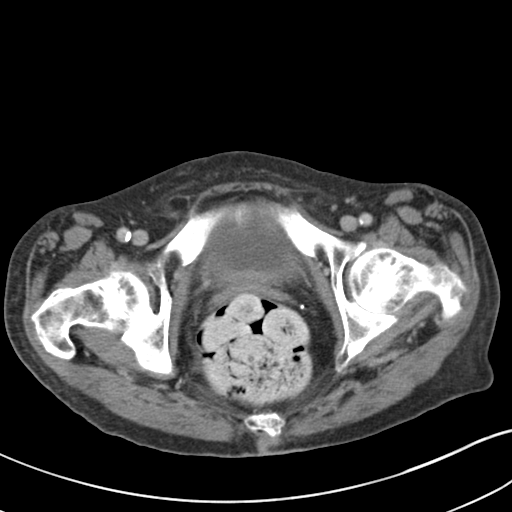
[im 19/83  soft-tissue]
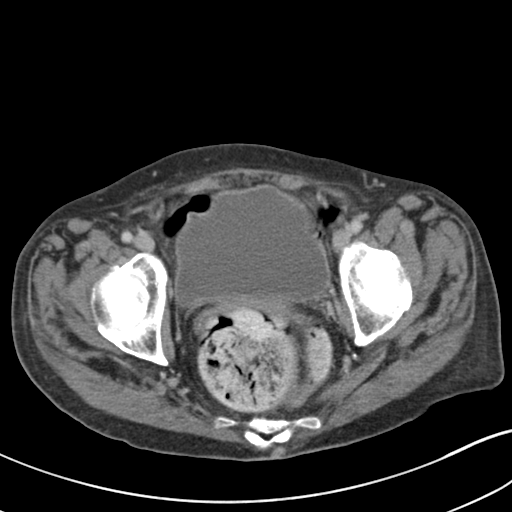
[im 23/83  soft-tissue]
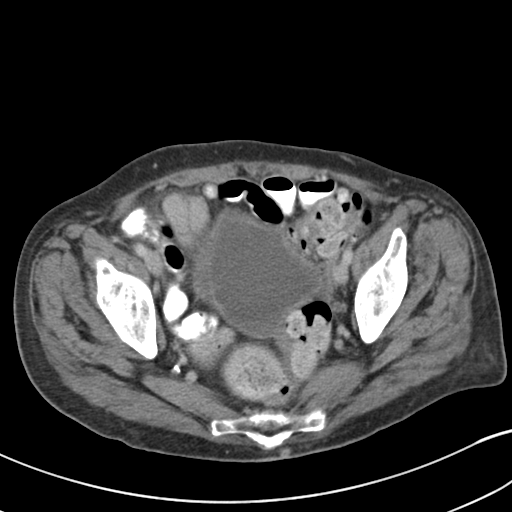
[im 32/83  soft-tissue]
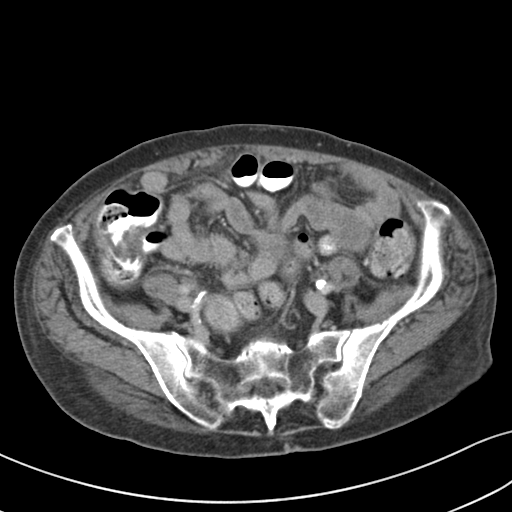
[im 37/83  soft-tissue]
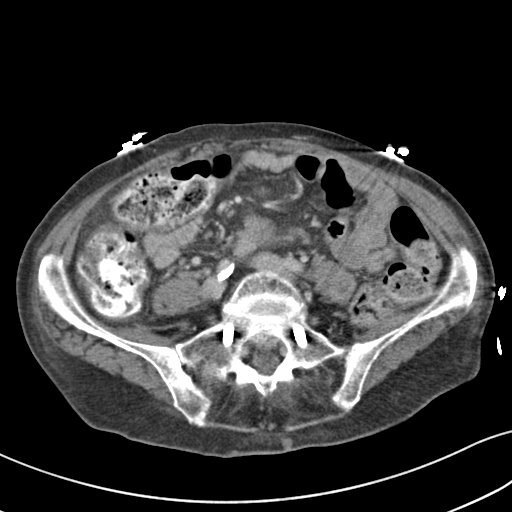
[im 46/83  soft-tissue]
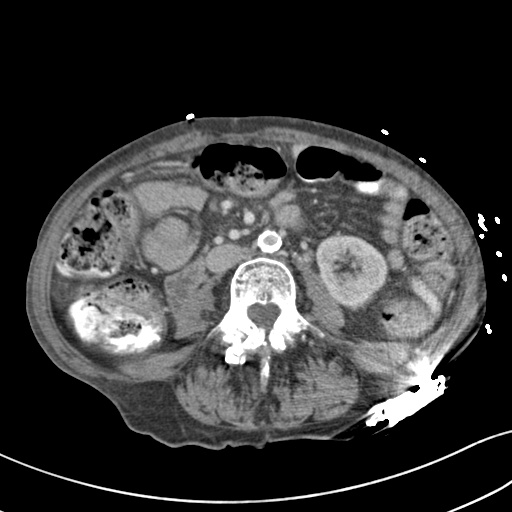
[im 51/83  soft-tissue]
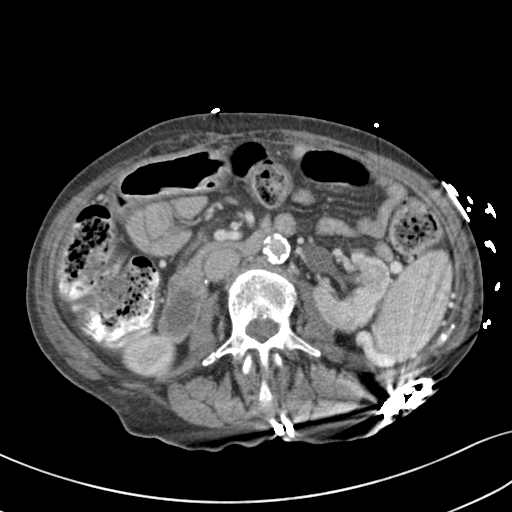
[im 60/83  soft-tissue]
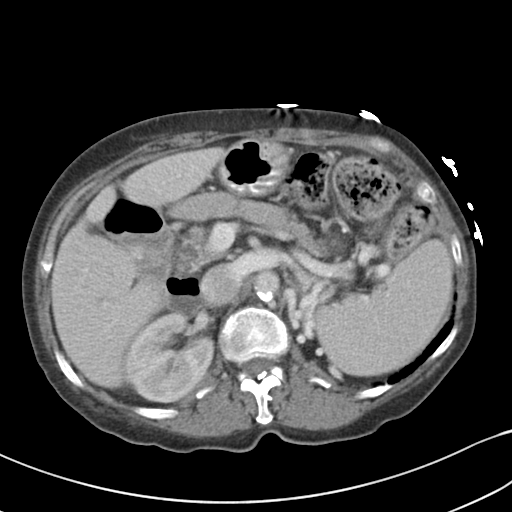
[im 60/83  bone]
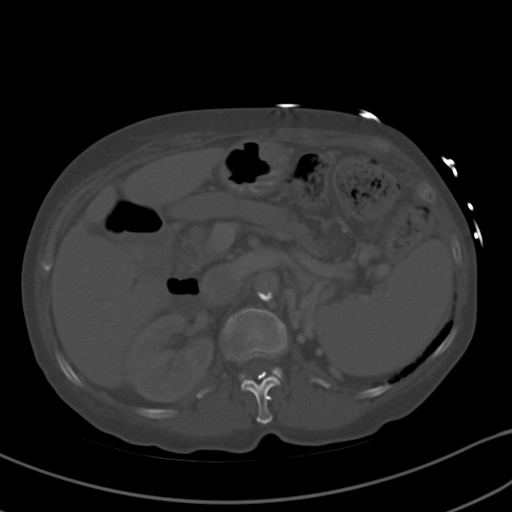
[im 64/83  soft-tissue]
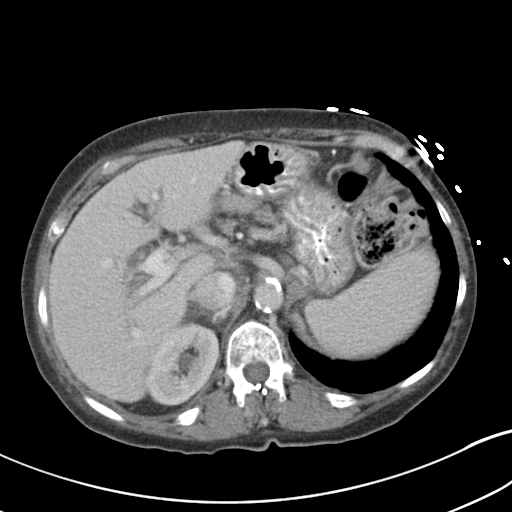
[im 69/83  soft-tissue]
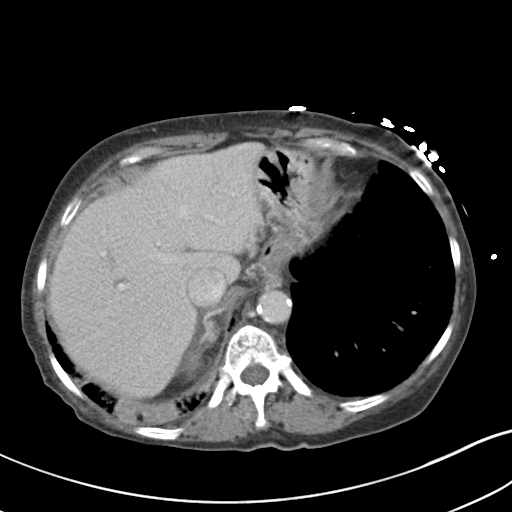
[im 78/83  soft-tissue]
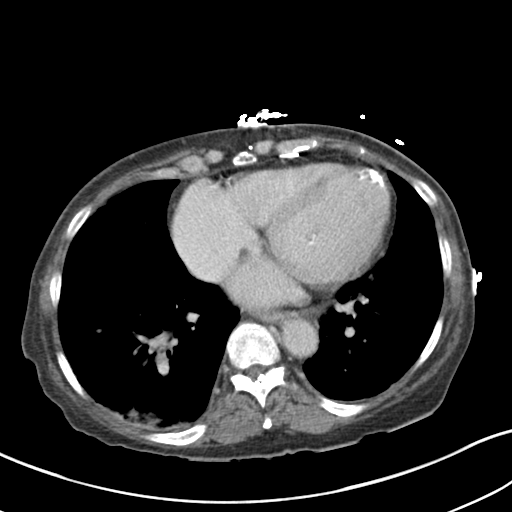

[Series 3: coronal a/|p · coronal · 0.66mm/px · 3 of 76 slices shown]
[im 26/76  soft-tissue]
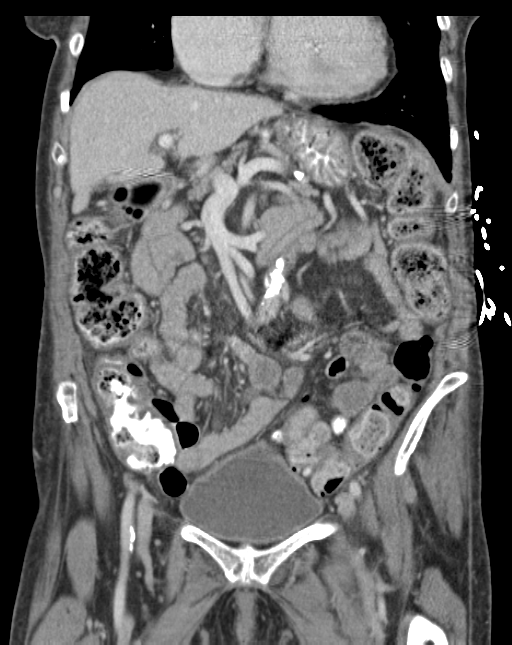
[im 34/76  soft-tissue]
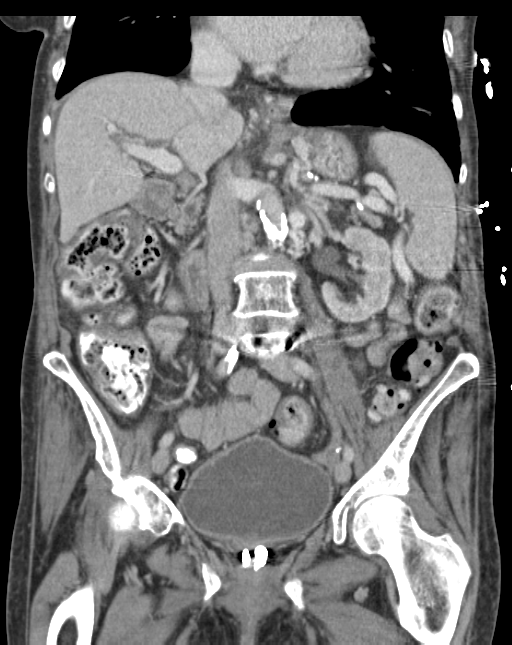
[im 42/76  soft-tissue]
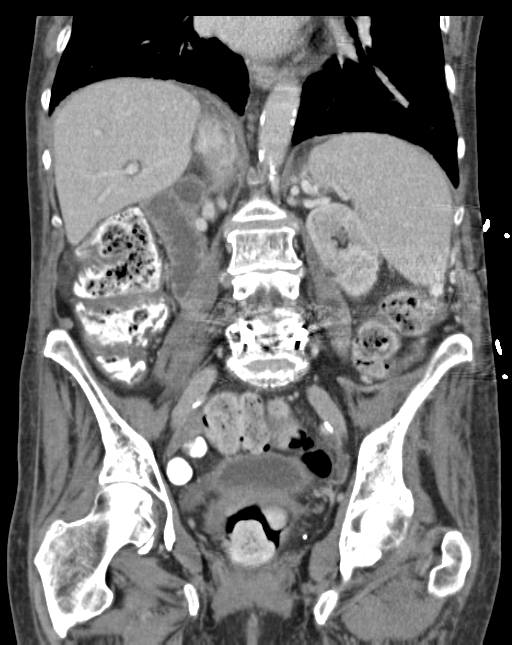

[15 of 46 positions shown; findings below may reference images not displayed]

FINDINGS: Lower chest: There is prominent patchy consolidation in the
dependent basilar right lower lobe. Mild patchy ground-glass opacity
in the peripheral left lower lobe. Visualized sternotomy wire is
intact. Myocardial calcifications at the left ventricular apex from
prior myocardial infarction. Coronary atherosclerosis.

Hepatobiliary: Normal liver with no liver mass. Cholecystectomy.
Bile ducts are within expected post cholecystectomy limits with
common bile duct diameter 5 mm.

Pancreas: Atrophic appearing pancreas with no pancreatic mass or
pancreatic duct dilation.

Spleen: Normal size. No mass.

Adrenals/Urinary Tract: Normal adrenals. No hydronephrosis. No renal
mass. Normal bladder. There is crescentic high density material
surrounding the urethra, possibly representing post treatment
changes, correlate with surgical history.

Stomach/Bowel: Grossly normal stomach. Normal caliber small bowel
with no small bowel wall thickening. Status post appendectomy . Oral
contrast progresses to the rectum. Moderate stool throughout the
colon and rectum. There is a suggestion of mild wall thickening in
the cecum, ascending colon and hepatic flexure of the colon with
associated mild pericolonic fat stranding.

Vascular/Lymphatic: Markedly atherosclerotic nonaneurysmal abdominal
aorta. Patent portal, splenic, hepatic and renal veins. Large left
splenorenal shunt. No pathologically enlarged lymph nodes in the
abdomen or pelvis.

Reproductive: Status post hysterectomy, with no abnormal findings at
the vaginal cuff. No adnexal mass.

Other: No pneumoperitoneum, ascites or focal fluid collection.

Musculoskeletal: No aggressive appearing focal osseous lesions.
Patient is status post bilateral posterior L5-S1 fusion, with no
evidence of hardware fracture or loosening. Moderate degenerative
changes in the visualized thoracolumbar spine. Left back
subcutaneous spinal stimulator demonstrates 2 leads coursing into
the thoracic spinal canal, with the tips not seen on this study.
IMPRESSION: 1. Patchy basilar right lower lobe consolidation and mild patchy
left lower lobe ground-glass opacities, favor multifocal pneumonia
and/or aspiration.
2. Mild colonic wall thickening and pericolonic fat stranding in the
proximal colon, suggesting a nonspecific mild infectious or
inflammatory colitis, with the differential including C. diff
colitis. No evidence of bowel obstruction.
3. Large left splenorenal shunt of uncertain etiology. Portal,
hepatic, splenic and renal veins appear patent. No macroscopic
evidence of cirrhosis. No ascites.
4. Coronary atherosclerosis and remote myocardial infarct at the
left ventricular apex.
5. Crescentic high density material surrounding the urethra,
correlate with surgical history.
